# Patient Record
Sex: Female | Born: 1960 | Race: Black or African American | Hispanic: No | Marital: Single | Smoking: Current every day smoker
Health system: Southern US, Community
[De-identification: ages and names within clinical notes are randomized; demographics above are authoritative.]

## PROBLEM LIST (undated history)

## (undated) DIAGNOSIS — I619 Nontraumatic intracerebral hemorrhage, unspecified: Secondary | ICD-10-CM

## (undated) DIAGNOSIS — E785 Hyperlipidemia, unspecified: Secondary | ICD-10-CM

## (undated) DIAGNOSIS — F329 Major depressive disorder, single episode, unspecified: Secondary | ICD-10-CM

## (undated) DIAGNOSIS — F32A Depression, unspecified: Secondary | ICD-10-CM

## (undated) DIAGNOSIS — Q282 Arteriovenous malformation of cerebral vessels: Secondary | ICD-10-CM

## (undated) DIAGNOSIS — I10 Essential (primary) hypertension: Secondary | ICD-10-CM

## (undated) DIAGNOSIS — I639 Cerebral infarction, unspecified: Secondary | ICD-10-CM

## (undated) HISTORY — PX: TUBAL LIGATION: SHX77

---

## 2013-09-17 ENCOUNTER — Emergency Department: Payer: Self-pay | Admitting: Emergency Medicine

## 2013-09-17 LAB — BASIC METABOLIC PANEL
Anion Gap: 3 — ABNORMAL LOW (ref 7–16)
Chloride: 112 mmol/L — ABNORMAL HIGH (ref 98–107)
Creatinine: 0.86 mg/dL (ref 0.60–1.30)
EGFR (African American): >60
Osmolality: 274 (ref 275–301)
Potassium: 4.1 mmol/L (ref 3.5–5.1)

## 2013-09-17 LAB — CBC WITH DIFFERENTIAL/PLATELET
Basophil #: 0.1 10*3/uL (ref 0.0–0.1)
Eosinophil %: 1.5 %
HCT: 40.3 % (ref 35.0–47.0)
HGB: 12.9 g/dL (ref 12.0–16.0)
Lymphocyte #: 1.4 10*3/uL (ref 1.0–3.6)
MCH: 21.6 pg — ABNORMAL LOW (ref 26.0–34.0)
MCHC: 32 g/dL (ref 32.0–36.0)
MCV: 67 fL — ABNORMAL LOW (ref 80–100)
Monocyte %: 8 %
RDW: 15.7 % — ABNORMAL HIGH (ref 11.5–14.5)
WBC: 6.1 10*3/uL (ref 3.6–11.0)

## 2013-09-17 LAB — URINALYSIS, COMPLETE
Bacteria: NONE SEEN
Glucose,UR: NEGATIVE mg/dL (ref 0–75)
Ph: 5 (ref 4.5–8.0)
Protein: NEGATIVE
RBC,UR: 4 /HPF (ref 0–5)
Specific Gravity: 1.018 (ref 1.003–1.030)
Squamous Epithelial: 1
WBC UR: 29 /HPF (ref 0–5)

## 2013-09-17 LAB — DRUG SCREEN, URINE
Amphetamines, Ur Screen: NEGATIVE (ref ?–1000)
Barbiturates, Ur Screen: NEGATIVE (ref ?–200)
Cannabinoid 50 Ng, Ur ~~LOC~~: NEGATIVE (ref ?–50)
Cocaine Metabolite,Ur ~~LOC~~: NEGATIVE (ref ?–300)
MDMA (Ecstasy)Ur Screen: NEGATIVE (ref ?–500)
Methadone, Ur Screen: NEGATIVE (ref ?–300)
Opiate, Ur Screen: NEGATIVE (ref ?–300)

## 2013-09-17 LAB — ETHANOL
Ethanol %: <0.003 % (ref 0.000–0.080)
Ethanol: <3 mg/dL

## 2013-09-17 LAB — PROTIME-INR
INR: 1
Prothrombin Time: 13.7 secs (ref 11.5–14.7)

## 2013-09-17 LAB — TSH: Thyroid Stimulating Horm: 0.75 u[IU]/mL

## 2013-09-17 LAB — SALICYLATE LEVEL: Salicylates, Serum: 3.1 mg/dL — ABNORMAL HIGH

## 2013-09-17 LAB — ACETAMINOPHEN LEVEL: Acetaminophen: <2 ug/mL

## 2013-09-18 LAB — URINE CULTURE

## 2013-11-09 DIAGNOSIS — Z8774 Personal history of (corrected) congenital malformations of heart and circulatory system: Secondary | ICD-10-CM

## 2013-12-02 ENCOUNTER — Emergency Department: Payer: Self-pay | Admitting: Internal Medicine

## 2013-12-02 LAB — URINALYSIS, COMPLETE
Glucose,UR: NEGATIVE mg/dL (ref 0–75)
Hyaline Cast: 8
Specific Gravity: 1.033 (ref 1.003–1.030)
Squamous Epithelial: 2

## 2013-12-02 LAB — GC/CHLAMYDIA PROBE AMP

## 2013-12-03 LAB — WET PREP, GENITAL

## 2013-12-21 HISTORY — PX: BRAIN SURGERY: SHX531

## 2015-05-16 DIAGNOSIS — Z720 Tobacco use: Secondary | ICD-10-CM | POA: Diagnosis present

## 2015-05-16 DIAGNOSIS — I10 Essential (primary) hypertension: Secondary | ICD-10-CM | POA: Diagnosis present

## 2015-10-25 DIAGNOSIS — F482 Pseudobulbar affect: Secondary | ICD-10-CM | POA: Insufficient documentation

## 2015-10-25 DIAGNOSIS — G2581 Restless legs syndrome: Secondary | ICD-10-CM | POA: Insufficient documentation

## 2016-01-14 DIAGNOSIS — G4733 Obstructive sleep apnea (adult) (pediatric): Secondary | ICD-10-CM | POA: Insufficient documentation

## 2016-05-24 ENCOUNTER — Emergency Department
Admission: EM | Admit: 2016-05-24 | Discharge: 2016-05-24 | Disposition: A | Discharge status: Home / Self Care | Payer: Medicare Other | Attending: Emergency Medicine | Admitting: Emergency Medicine

## 2016-05-24 ENCOUNTER — Emergency Department: Payer: Medicare Other

## 2016-05-24 DIAGNOSIS — Y999 Unspecified external cause status: Secondary | ICD-10-CM | POA: Insufficient documentation

## 2016-05-24 DIAGNOSIS — S9032XA Contusion of left foot, initial encounter: Secondary | ICD-10-CM | POA: Diagnosis not present

## 2016-05-24 DIAGNOSIS — Y939 Activity, unspecified: Secondary | ICD-10-CM | POA: Insufficient documentation

## 2016-05-24 DIAGNOSIS — S40022A Contusion of left upper arm, initial encounter: Secondary | ICD-10-CM | POA: Insufficient documentation

## 2016-05-24 DIAGNOSIS — S99922A Unspecified injury of left foot, initial encounter: Secondary | ICD-10-CM | POA: Diagnosis present

## 2016-05-24 DIAGNOSIS — IMO0002 Reserved for concepts with insufficient information to code with codable children: Secondary | ICD-10-CM

## 2016-05-24 DIAGNOSIS — Y929 Unspecified place or not applicable: Secondary | ICD-10-CM | POA: Diagnosis not present

## 2016-05-24 MED ORDER — NAPROXEN 500 MG PO TABS: 500.0000 mg | ORAL_TABLET | Freq: Two times a day (BID) | ORAL | Status: DC
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
## 2016-05-24 NOTE — Discharge Instructions (Signed)
Foot Contusion A foot contusion is a deep bruise to the foot. Contusions are the result of an injury that caused bleeding under the skin. The contusion may turn blue, purple, or yellow. Minor injuries will give you a painless contusion, but more severe contusions may stay painful and swollen for a few weeks. CAUSES  A foot contusion comes from a direct blow to that area, such as a heavy object falling on the foot. SYMPTOMS   Swelling of the foot.  Discoloration of the foot.  Tenderness or soreness of the foot. DIAGNOSIS  You will have a physical exam and will be asked about your history. You may need an X-ray of your foot to look for a broken bone (fracture).  TREATMENT  An elastic wrap may be recommended to support your foot. Resting, elevating, and applying cold compresses to your foot are often the best treatments for a foot contusion. Over-the-counter medicines may also be recommended for pain control. HOME CARE INSTRUCTIONS   Put ice on the injured area.  Put ice in a plastic bag.  Place a towel between your skin and the bag.  Leave the ice on for 15-20 minutes, 03-04 times a day.  Only take over-the-counter or prescription medicines for pain, discomfort, or fever as directed by your caregiver.  If told, use an elastic wrap as directed. This can help reduce swelling. You may remove the wrap for sleeping, showering, and bathing. If your toes become numb, cold, or blue, take the wrap off and reapply it more loosely.  Elevate your foot with pillows to reduce swelling.  Try to avoid standing or walking while the foot is painful. Do not resume use until instructed by your caregiver. Then, begin use gradually. If pain develops, decrease use. Gradually increase activities that do not cause discomfort until you have normal use of your foot.  See your caregiver as directed. It is very important to keep all follow-up appointments in order to avoid any lasting problems with your foot,  including long-term (chronic) pain. SEEK IMMEDIATE MEDICAL CARE IF:   You have increased redness, swelling, or pain in your foot.  Your swelling or pain is not relieved with medicines.  You have loss of feeling in your foot or are unable to move your toes.  Your foot turns cold or blue.  You have pain when you move your toes.  Your foot becomes warm to the touch.  Your contusion does not improve in 2 days. MAKE SURE YOU:   Understand these instructions.  Will watch your condition.  Will get help right away if you are not doing well or get worse.   This information is not intended to replace advice given to you by your health care provider. Make sure you discuss any questions you have with your health care provider.   Document Released: 09/28/2006 Document Revised: 06/07/2012 Document Reviewed: 08/13/2015 Elsevier Interactive Patient Education 2016 [employer]  Cryotherapy Cryotherapy is when you put ice on your injury. Ice helps lessen pain and puffiness (swelling) after an injury. Ice works the best when you start using it in the first 24 to 48 hours after an injury. HOME CARE  Put a dry or damp towel between the ice pack and your skin.  You may press gently on the ice pack.  Leave the ice on for no more than 10 to 20 minutes at a time.  Check your skin after 5 minutes to make sure your skin is okay.  Rest at least  20 minutes between ice pack uses. °· Stop using ice when your skin loses feeling (numbness). °· Do not use ice on someone who cannot tell you when it hurts. This includes small children and people with memory problems (dementia). °GET HELP RIGHT AWAY IF: °· You have white spots on your skin. °· Your skin turns blue or pale. °· Your skin feels waxy or hard. °· Your puffiness gets worse. °MAKE SURE YOU:  °· Understand these instructions. °· Will watch your condition. °· Will get help right away if you are not doing well or get worse. °  °This information is not  intended to replace advice given to you by your health care provider. Make sure you discuss any questions you have with your health care provider. °  °Document Released: 05/25/2008 Document Revised: 02/29/2012 Document Reviewed: 07/30/2011 °Elsevier Interactive Patient Education ©2016 Elsevier Inc. ° °

## 2017-02-09 ENCOUNTER — Emergency Department: Payer: Medicare Other

## 2017-02-09 ENCOUNTER — Emergency Department
Admission: EM | Admit: 2017-02-09 | Discharge: 2017-02-09 | Disposition: A | Discharge status: Home / Self Care | Payer: Medicare Other | Attending: Emergency Medicine | Admitting: Emergency Medicine

## 2017-02-09 DIAGNOSIS — M5412 Radiculopathy, cervical region: Secondary | ICD-10-CM | POA: Diagnosis not present

## 2017-02-09 DIAGNOSIS — Z79899 Other long term (current) drug therapy: Secondary | ICD-10-CM | POA: Diagnosis not present

## 2017-02-09 DIAGNOSIS — Z5181 Encounter for therapeutic drug level monitoring: Secondary | ICD-10-CM | POA: Insufficient documentation

## 2017-02-09 DIAGNOSIS — I10 Essential (primary) hypertension: Secondary | ICD-10-CM | POA: Diagnosis not present

## 2017-02-09 DIAGNOSIS — R519 Headache, unspecified: Secondary | ICD-10-CM

## 2017-02-09 DIAGNOSIS — R51 Headache: Secondary | ICD-10-CM

## 2017-02-09 HISTORY — DX: Depression, unspecified: F32.A

## 2017-02-09 HISTORY — DX: Arteriovenous malformation of cerebral vessels: Q28.2

## 2017-02-09 HISTORY — DX: Major depressive disorder, single episode, unspecified: F32.9

## 2017-02-09 HISTORY — DX: Nontraumatic intracerebral hemorrhage, unspecified: I61.9

## 2017-02-09 HISTORY — DX: Essential (primary) hypertension: I10

## 2017-02-09 LAB — COMPREHENSIVE METABOLIC PANEL
ALBUMIN: 3.9 g/dL (ref 3.5–5.0)
ALT: 17 U/L (ref 14–54)
AST: 18 U/L (ref 15–41)
Alkaline Phosphatase: 76 U/L (ref 38–126)
Anion gap: 6 (ref 5–15)
BILIRUBIN TOTAL: 0.3 mg/dL (ref 0.3–1.2)
BUN: 12 mg/dL (ref 6–20)
CO2: 24 mmol/L (ref 22–32)
Calcium: 8.9 mg/dL (ref 8.9–10.3)
Chloride: 109 mmol/L (ref 101–111)
Creatinine, Ser: 0.66 mg/dL (ref 0.44–1.00)
GFR calc Af Amer: >60 mL/min (ref >60)
GFR calc non Af Amer: >60 mL/min (ref >60)
GLUCOSE: 90 mg/dL (ref 65–99)
POTASSIUM: 3.8 mmol/L (ref 3.5–5.1)
Sodium: 139 mmol/L (ref 135–145)
TOTAL PROTEIN: 7.4 g/dL (ref 6.5–8.1)

## 2017-02-09 LAB — CBC WITH DIFFERENTIAL/PLATELET
BASOS ABS: 0 10*3/uL (ref 0–0.1)
Basophils Relative: 1 %
EOS PCT: 3 %
Eosinophils Absolute: 0.1 10*3/uL (ref 0–0.7)
HCT: 37.9 % (ref 35.0–47.0)
Hemoglobin: 12.4 g/dL (ref 12.0–16.0)
LYMPHS ABS: 1.7 10*3/uL (ref 1.0–3.6)
LYMPHS PCT: 31 %
MCH: 21.5 pg — AB (ref 26.0–34.0)
MCHC: 32.8 g/dL (ref 32.0–36.0)
MCV: 65.5 fL — AB (ref 80.0–100.0)
MONO ABS: 0.4 10*3/uL (ref 0.2–0.9)
MONOS PCT: 7 %
Neutro Abs: 3.1 10*3/uL (ref 1.4–6.5)
Neutrophils Relative %: 58 %
PLATELETS: 257 10*3/uL (ref 150–440)
RBC: 5.78 MIL/uL — ABNORMAL HIGH (ref 3.80–5.20)
RDW: 16.1 % — AB (ref 11.5–14.5)
WBC: 5.3 10*3/uL (ref 3.6–11.0)

## 2017-02-09 LAB — PROTIME-INR
INR: 0.9
Prothrombin Time: 12.1 seconds (ref 11.4–15.2)

## 2017-02-09 MED ORDER — BUTALBITAL-APAP-CAFFEINE 50-325-40 MG PO TABS: 1.0000 | ORAL_TABLET | Freq: Four times a day (QID) | ORAL | 0 refills | Status: AC | PRN

## 2017-02-09 MED ORDER — PROCHLORPERAZINE EDISYLATE 5 MG/ML IJ SOLN
10.0000 mg | Freq: Once | INTRAMUSCULAR | Status: AC
  Administered 2017-02-09: 10 mg via INTRAVENOUS
  Filled 2017-02-09: qty 2

## 2017-02-09 MED ORDER — MORPHINE SULFATE (PF) 4 MG/ML IV SOLN
4.0000 mg | Freq: Once | INTRAVENOUS | Status: AC
  Administered 2017-02-09: 4 mg via INTRAVENOUS
  Filled 2017-02-09: qty 1

## 2017-02-09 MED ORDER — IOPAMIDOL (ISOVUE-370) INJECTION 76%
75.0000 mL | Freq: Once | INTRAVENOUS | Status: AC | PRN
  Administered 2017-02-09: 75 mL via INTRAVENOUS

## 2017-02-09 NOTE — Discharge Instructions (Signed)
Please return to the emergency department for any new or worsening symptoms such as worsening headache, numbness, weakness, or for any other concerns. Follow up with your primary care physician as scheduled. The pain in her neck and shoulder may be related to a pinched nerve in her neck so you might need an MRI of her neck in the future.  Ct Angio Head W Or Wo Contrast  Result Date: 02/09/2017 CLINICAL DATA:  Sudden onset right-sided headache. Neck pain and right arm weakness. History of craniotomy for AVM. History of cerebral hemorrhage EXAM: CT ANGIOGRAPHY HEAD AND NECK TECHNIQUE: Multidetector CT imaging of the head and neck was performed using the standard protocol during bolus administration of intravenous contrast. Multiplanar CT image reconstructions and MIPs were obtained to evaluate the vascular anatomy. Carotid stenosis measurements (when applicable) are obtained utilizing NASCET criteria, using the distal internal carotid diameter as the denominator. CONTRAST:  75 mL Isovue 370 IV COMPARISON:  CT head 09/17/2013.  CT head 02/09/2017 FINDINGS: CTA NECK FINDINGS Aortic arch: Bovine branching of the aortic arch. Proximal great vessels widely patent. Aortic arch intact. Right carotid system: Right carotid artery widely patent without significant stenosis. Early atherosclerotic calcification of the bifurcation. No dissection Left carotid system: Left carotid artery widely patent. Mild atherosclerotic calcification at the bifurcation without significant stenosis. Negative for dissection. Vertebral arteries: Both vertebral arteries are patent to the basilar with minimal atherosclerotic calcification distally. No significant stenosis or dissection Skeleton: Disc degeneration and spurring at C5-6 and C6-7. No acute skeletal abnormality. Other neck: Negative for mass or adenopathy. Upper chest: Negative Review of the MIP images confirms the above findings CTA HEAD FINDINGS Anterior circulation: Early  atherosclerotic calcification in the cavernous carotid bilaterally without stenosis or aneurysm. Single anterior cerebral artery compatible with azygos anterior cerebral artery variant. Both middle cerebral arteries are patent without stenosis. Negative for vascular malformation or aneurysm. Posterior circulation: Both vertebral arteries patent to the basilar. Mild atherosclerotic calcification distal vertebral artery bilaterally without stenosis. Basilar widely patent. Posterior cerebral arteries patent bilaterally. Negative for vascular malformation. No early draining vein. Venous sinuses: Superior sagittal sinus patent. Straight sinus patent. Transverse and sigmoid sinus patent bilaterally. 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 Delayed phase: Normal enhancement on delayed imaging. Prior right parietal craniotomy with encephalomalacia in the right parietal lobe, by history related to AVM treatment. 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 No dissection. Negative for intracranial stenosis or large vessel occlusion. Negative for residual vascular malformation. 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 Electronically Signed   By: Marlan Palau M.D.   On: 02/09/2017 19:19   Ct Head Wo Contrast  Result Date: 02/09/2017 CLINICAL DATA:  Headache with right arm tingling. EXAM: CT HEAD WITHOUT CONTRAST TECHNIQUE: Contiguous axial images were obtained from the base of the skull through the vertex without intravenous contrast. COMPARISON:  09/17/2013 FINDINGS: Brain: Encephalomalacia right parietal occipital region likely postsurgical given the overlying craniotomy defect. No evidence for acute hemorrhage, hydrocephalus, mass  lesion, or abnormal extra-axial fluid collection. Vascular: Atherosclerotic calcification is visualized in the carotid arteries. No dense MCA sign. Major dural sinuses are unremarkable. Skull: Right parieto-occipital craniotomy defect. Sinuses/Orbits: The visualized paranasal sinuses and mastoid air cells are clear. Visualized portions of the globes and intraorbital fat are unremarkable. Other: None.  IMPRESSION: 1. 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 By report, this is AVM related. 2. No acute intracranial abnormality. Electronically Signed   By: Kennith CenterEric  Mansell M.D.   On: 02/09/2017 16:40   Ct Angio Neck W And/or Wo Contrast  Result Date: 02/09/2017 CLINICAL DATA:  Sudden onset right-sided headache. Neck pain and right arm weakness. History of craniotomy for AVM. History of cerebral hemorrhage EXAM: CT ANGIOGRAPHY HEAD AND NECK TECHNIQUE: Multidetector CT imaging of the head and neck was performed using the standard protocol during bolus administration of intravenous contrast. Multiplanar CT image reconstructions and MIPs were obtained to evaluate the vascular anatomy. Carotid stenosis measurements (when applicable) are obtained utilizing NASCET criteria, using the distal internal carotid diameter as the denominator. CONTRAST:  75 mL Isovue 370 IV COMPARISON:  CT head 09/17/2013.  CT head 02/09/2017 FINDINGS: CTA NECK FINDINGS Aortic arch: Bovine branching of the aortic arch. Proximal great vessels widely patent. Aortic arch intact. Right carotid system: Right carotid artery widely patent without significant stenosis. Early atherosclerotic calcification of the bifurcation. No dissection Left carotid system: Left carotid artery widely patent. Mild atherosclerotic calcification at the bifurcation without significant stenosis. Negative for dissection. Vertebral arteries: Both vertebral arteries are patent to the basilar with minimal atherosclerotic calcification  distally. No significant stenosis or dissection Skeleton: Disc degeneration and spurring at C5-6 and C6-7. No acute skeletal abnormality. Other neck: Negative for mass or adenopathy. Upper chest: Negative Review of the MIP images confirms the above findings CTA HEAD FINDINGS Anterior circulation: Early atherosclerotic calcification in the cavernous carotid bilaterally without stenosis or aneurysm. Single anterior cerebral artery compatible with azygos anterior cerebral artery variant. Both middle cerebral arteries are patent without stenosis. Negative for vascular malformation or aneurysm. Posterior circulation: Both vertebral arteries patent to the basilar. Mild atherosclerotic calcification distal vertebral artery bilaterally without stenosis. Basilar widely patent. Posterior cerebral arteries patent bilaterally. Negative for vascular malformation. No early draining vein. Venous sinuses: Superior sagittal sinus patent. Straight sinus patent. Transverse and sigmoid sinus patent bilaterally. 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 Delayed phase: Normal enhancement on delayed imaging. Prior right parietal craniotomy with encephalomalacia in the right parietal lobe, by history related to AVM treatment. 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 No dissection. Negative for intracranial stenosis or large vessel occlusion. Negative for residual vascular malformation. 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 Electronically Signed   By: Marlan Palauharles  Clark M.D.   On: 02/09/2017 19:19

## 2017-02-09 NOTE — ED Provider Notes (Signed)
[facility] Emergency Department Provider Note  ____________________________________________   First MD Initiated Contact with Patient 02/09/17 1734     (approximate)  I have reviewed the triage vital signs and the nursing notes.   HISTORY  Chief Complaint Headache and Tingling    HPI Kristin Burgess is a 56 y.o. female who comes to the emergency department with 3 days of sudden onset maximal onset right sided throbbing headache similar to previous headaches.In 2014 and she had a ruptured AVM repaired at [facility]. She has some residual weakness in her left upper extremity but for the past 2 weeks or so has noted aching discomfort in her right shoulder and right arm. She denies double vision or blurred vision. She has some photophobia. No neck stiffness. No chest pain or shortness of breath. No abdominal pain nausea or vomiting. No medications are hormone for her headache.   Past Medical History:  Diagnosis Date  . AVM (arteriovenous malformation) brain   . Brain bleed (HCC)   . Depression   . Hypertension     There are no active problems to display for this patient.   Past Surgical History:  Procedure Laterality Date  . BRAIN SURGERY      Prior to Admission medications   Medication Sig Start Date End Date Taking? Authorizing Provider  butalbital-acetaminophen-caffeine (FIORICET, ESGIC) [phone] MG tablet Take 1-2 tablets by mouth every 6 (six) hours as needed for headache. 02/09/17 02/09/18  Merrily Brittle, MD  naproxen (NAPROSYN) 500 MG tablet Take 1 tablet (500 mg total) by mouth 2 (two) times daily with a meal. 05/24/16   Jami L Hagler, PA-C    Allergies Patient has no known allergies.  No family history on file.  Social History Social History  Substance Use Topics  . Smoking status: Never Smoker  . Smokeless tobacco: Never Used  . Alcohol use No    Review of Systems Constitutional: No fever/chills Eyes: No visual changes. ENT:  No sore throat. Cardiovascular: Denies chest pain. Respiratory: Denies shortness of breath. Gastrointestinal: No abdominal pain.  No nausea, no vomiting.  No diarrhea.  No constipation. Genitourinary: Negative for dysuria. Musculoskeletal: Negative for back pain. Skin: Negative for rash. Neurological: Positive for headache  10-point ROS otherwise negative.  ____________________________________________   PHYSICAL EXAM:  VITAL SIGNS: ED Triage Vitals [02/09/17 1607]  Enc Vitals Group     BP (!) 177/98     Pulse Rate 89     Resp 18     Temp 98.1 F (36.7 C)     Temp Source Oral     SpO2 97 %     Weight 250 lb (113.4 kg)     Height 5\' 5"  (1.651 m)     Head Circumference      Peak Flow      Pain Score 8     Pain Loc      Pain Edu?      Excl. in GC?     Constitutional: Alert and oriented 4 pleasant and cooperative speaks in full clear sentences no diaphoresis Eyes: Conjunctivae are normal. PERRL. EOMI. pupils are midrange and reactive bilaterally equal brisk bleeding on funduscopy Head: Atraumatic. Nose: No congestion/rhinnorhea. Mouth/Throat: Mucous membranes are moist.  Oropharynx non-erythematous. Neck: No stridor.   Cardiovascular: Normal rate, regular rhythm. Grossly normal heart sounds.  Good peripheral circulation. Respiratory: Normal respiratory effort.  No retractions. Lungs CTAB. Gastrointestinal: Soft and nontender. No distention. No abdominal bruits. No CVA tenderness. Musculoskeletal: No lower  extremity tenderness nor edema.  No joint effusions. Neurologic:  Normal speech and language. No pronator drift 5 out of 5 grips biceps triceps 5 out of 5 hip flexion and extension plantar flexion dorsiflexion sensation intact to light touch throughout 2+ DTRs no ankle clonus Skin:  Skin is warm, dry and intact. No rash noted. Psychiatric: Mood and affect are normal. Speech and behavior are normal.  ____________________________________________   LABS (all labs  ordered are listed, but only abnormal results are displayed)  Labs Reviewed  CBC WITH DIFFERENTIAL/PLATELET - Abnormal; Notable for the following:       Result Value   RBC 5.78 (*)    MCV 65.5 (*)    MCH 21.5 (*)    RDW 16.1 (*)    All other components within normal limits  COMPREHENSIVE METABOLIC PANEL  PROTIME-INR   ____________________________________________  EKG  ____________________________________________  RADIOLOGY  Head CT with no bleed and no aneurysm ____________________________________________   PROCEDURES  Procedure(s) performed: no  Procedures  Critical Care performed: no  ____________________________________________   INITIAL IMPRESSION / ASSESSMENT AND PLAN / ED COURSE  Pertinent labs & imaging results that were available during my care of the patient were reviewed by me and considered in my medical decision making (see chart for details).  Fortunately the patient is neurologically intact. Her headache was sudden onset and different from previous headaches in the setting of a previous known AV malformation which was concerning for possible subarachnoid hemorrhage. CT angiogram done which is negative for acute pathology and the patient's symptoms are improved. At this point she is comfortable being discharged home with follow-up with neurology and neurosurgery. Strict return to the emergency department precautions given. She is medically stable for outpatient management.      ____________________________________________   FINAL CLINICAL IMPRESSION(S) / ED DIAGNOSES  Final diagnoses:  Nonintractable headache, unspecified chronicity pattern, unspecified headache type  Cervical radiculopathy      NEW MEDICATIONS STARTED DURING THIS VISIT:  Discharge Medication List as of 02/09/2017  7:52 PM    START taking these medications   Details  butalbital-acetaminophen-caffeine (FIORICET, ESGIC) 50-325-40 MG tablet Take 1-2 tablets by mouth every 6  (six) hours as needed for headache., Starting Tue 02/09/2017, Until Wed 02/09/2018, Print         Note:  This document was prepared using Dragon voice recognition software and may include unintentional dictation errors.     Merrily BrittleNeil Setsuko Robins, MD 02/10/17 1537

## 2017-02-09 NOTE — ED Notes (Signed)
Patient transported to CT 

## 2017-02-09 NOTE — ED Triage Notes (Signed)
Pt c/o frontal HA with right arm tingling pain for the past 2 weeks. Pt states she has a hx of brain bleed 1-2 years ago and was concerned.

## 2017-02-09 NOTE — ED Notes (Signed)

## 2017-04-02 DIAGNOSIS — Z87898 Personal history of other specified conditions: Secondary | ICD-10-CM

## 2017-04-08 ENCOUNTER — Other Ambulatory Visit: Payer: Self-pay

## 2017-04-26 NOTE — Progress Notes (Deleted)
04/27/2017 12:18 PM   Kristin Burgess March 16, 1961 [id number]  Referring provider: Cyndie Mull, DO [address] [place], [place] [address]  No chief complaint on file.   HPI: Patient is a 56 -year-old [place] American female who is referred to [place] by, Dr. Ludwig Clarks, for urinary incontinence.  Patient states that she has had urinary incontinence for ***.  Patient has incontinence with ***.   She is experiencing *** incontinent episodes during the day. She is experiencing *** incontinent episodes during the night.  Her incontinence volume is ***.   She is wearing *** pads/depends daily.    She is having associated urinary frequency, urgency, dysuria, nocturia, intermittency, hesitancy, straining to urinate and weak urinary stream.   ***  She is not having associated urinary frequency, urgency, dysuria, nocturia, intermittency, hesitancy, straining to urinate and weak urinary stream.   ***  She does/does not have a history of urinary tract infections, STI's or injury to the bladder. ***  She denies/endorses dysuria, gross hematuria, suprapubic pain, back pain, abdominal pain or flank pain.***  She denies/endorses dysuria, gross hematuria, suprapubic pain, back pain, abdominal pain or flank pain.***  She has not had any recent fevers, chills, nausea or vomiting. ***  She has not had any recent fevers, chills, nausea or vomiting. ***  She does/does not have a history of nephrolithiasis, GU surgery or GU trauma. ***  She does/does not have a history of nephrolithiasis, GU surgery or GU trauma. ***  She is/is not sexually active.  She has/has not noted incontinence with sexual intercourse.  ***   She is post menopausal. ***  She admits to/denies constipation and/or diarrhea. ***  She is having/ not having pain with bladder filling.  ***  She has not had any recent imaging studies.    She is drinking *** of water daily.   She is drinking *** caffeinated beverages  daily.  She is drinking *** alcoholic beverages daily.    Her risk factors for incontinence are obesity, para T, vaginal delivery, a family history of incontinence, age, smoking, caffeine, diabetes, stroke, sleep apnea, depression, fecal incontinence, vaginal atrophy, oral estrogens, pelvic surgery, pelvic radiation and/or neurological disorders. ***  She is taking antihistamines, decongestants, benzo's, opioids, OAB medication, ACE inhibitors, alpha-agonists, alpha blockers, antiarrhythmics, diuretics, antidepressants, antipsychotics, skeletal muscle relaxants and/or oral estrogen.         PMH: Past Medical History:  Diagnosis Date  . AVM (arteriovenous malformation) brain   . Brain bleed (HCC)   . Depression   . Hypertension     Surgical History: Past Surgical History:  Procedure Laterality Date  . BRAIN SURGERY      Home Medications:  Allergies as of 04/27/2017   No Known Allergies     Medication List       Accurate as of 04/26/17 12:18 PM. Always use your most recent med list.          butalbital-acetaminophen-caffeine 50-325-40 MG tablet Commonly known as:  FIORICET, ESGIC Take 1-2 tablets by mouth every 6 (six) hours as needed for headache.   naproxen 500 MG tablet Commonly known as:  NAPROSYN Take 1 tablet (500 mg total) by mouth 2 (two) times daily with a meal.       Allergies: No Known Allergies  Family History: No family history on file.  Social History:  reports that she has never smoked. She has never used smokeless tobacco. She reports that she does not drink alcohol. Her drug history is  not on file.  ROS:                                        Physical Exam: There were no vitals taken for this visit.  Constitutional: Well nourished. Alert and oriented, No acute distress. HEENT: [place] AT, moist mucus membranes. Trachea midline, no masses. Cardiovascular: No clubbing, cyanosis, or edema. Respiratory: Normal respiratory  effort, no increased work of breathing. GI: Abdomen is soft, non tender, non distended, no abdominal masses. Liver and spleen not palpable.  No hernias appreciated.  Stool sample for occult testing is not indicated.   GU: No CVA tenderness.  No bladder fullness or masses.  Normal external genitalia, normal pubic hair distribution, no lesions.  Normal urethral meatus, no lesions, no prolapse, no discharge.   No urethral masses, tenderness and/or tenderness. No bladder fullness, tenderness or masses. Normal vagina mucosa, good estrogen effect, no discharge, no lesions, good pelvic support, no cystocele or rectocele noted.  No cervical motion tenderness.  Uterus is freely mobile and non-fixed.  No adnexal/parametria masses or tenderness noted.  Anus and perineum are without rashes or lesions.   *** Skin: No rashes, bruises or suspicious lesions. Lymph: No cervical or inguinal adenopathy. Neurologic: Grossly intact, no focal deficits, moving all 4 extremities. Psychiatric: Normal mood and affect.  Laboratory Data: Lab Results  Component Value Date   WBC 5.3 02/09/2017   HGB 12.4 02/09/2017   HCT 37.9 02/09/2017   MCV 65.5 (L) 02/09/2017   PLT 257 02/09/2017    Lab Results  Component Value Date   CREATININE 0.66 02/09/2017    Lab Results  Component Value Date   TSH 0.75 09/17/2013    Lab Results  Component Value Date   AST 18 02/09/2017   Lab Results  Component Value Date   ALT 17 02/09/2017     Urinalysis    Component Value Date/Time   COLORURINE Amber 12/02/2013 1650   APPEARANCEUR Hazy 12/02/2013 1650   LABSPEC 1.033 12/02/2013 1650   PHURINE 5.0 12/02/2013 1650   GLUCOSEU Negative 12/02/2013 1650   HGBUR Negative 12/02/2013 1650   BILIRUBINUR Negative 12/02/2013 1650   KETONESUR Trace 12/02/2013 1650   PROTEINUR 30 mg/dL 16/10/960412/13/2014 [id number]   NITRITE Negative 12/02/2013 1650   LEUKOCYTESUR Negative 12/02/2013 1650    Pertinent Imaging: ***  Assessment & Plan:   ***  Incontinence  - offered behavioral therapies  - bladder training  - bladder control strategies  - pelvic floor muscle training  - fluid management   - offered medical therapy with anticholinergic therapy or beta-3 adrenergic receptor agonist and the potential side effects of each therapy ***  - offered refer to gynecology for a pessary fitting ***  - offered an appointment with one of our surgeon for a possible pelvic sling procedure ***  - would like to try the beta-3 adrenergic receptor agonist (Myrbetriq).  Given Myrbetriq 25 mg samples, #28.  I have reviewed with the patient of the side effects of Myrbetriq, such as: elevation in BP, urinary retention and/or HA.  She will return in one month for PVR and symptom recheck.  ***  - would like to try anticholinergic therapy.  Given Vesicare 5mg /10mg , Toviaz 4mg /8mg  samples, # 28.   Advised of the side effects, such as: Dry eyes, dry mouth, constipation, mental confusion and/or urinary retention. ***  - RTC in 3 weeks for PVR  and symptom recheck ***     No Follow-up on file.  These notes generated with voice recognition software. I apologize for typographical errors.  Michiel Cowboy, PA-C  [facility] [address] [place], [place] [address] [phone]

## 2017-04-27 ENCOUNTER — Ambulatory Visit: Payer: Medicare Other | Admitting: Urology

## 2017-05-24 DIAGNOSIS — R32 Unspecified urinary incontinence: Secondary | ICD-10-CM | POA: Insufficient documentation

## 2017-05-31 ENCOUNTER — Ambulatory Visit (INDEPENDENT_AMBULATORY_CARE_PROVIDER_SITE_OTHER): Payer: Medicare Other | Admitting: Urology

## 2017-05-31 ENCOUNTER — Encounter: Payer: Self-pay | Admitting: Urology

## 2017-05-31 VITALS — BP 146/83 | HR 84 | Ht 65.0 in | Wt 264.2 lb

## 2017-05-31 DIAGNOSIS — R32 Unspecified urinary incontinence: Secondary | ICD-10-CM

## 2017-05-31 DIAGNOSIS — N3946 Mixed incontinence: Secondary | ICD-10-CM | POA: Diagnosis not present

## 2017-05-31 LAB — URINALYSIS, COMPLETE
BILIRUBIN UA: NEGATIVE
Glucose, UA: NEGATIVE
KETONES UA: NEGATIVE
NITRITE UA: NEGATIVE
PH UA: 5 (ref 5.0–7.5)
Protein, UA: NEGATIVE
Specific Gravity, UA: >1.03 — ABNORMAL HIGH (ref 1.005–1.030)
UUROB: 0.2 mg/dL (ref 0.2–1.0)

## 2017-05-31 LAB — MICROSCOPIC EXAMINATION

## 2017-05-31 NOTE — Progress Notes (Signed)
05/31/2017 2:59 PM   Kristin HashimotoAlisha Burgess 07/20/1961 [id number]  Referring provider: Cyndie MullSantayana, Kristin Patricia, DO [address] [address] [place], [place] [id number]  Chief Complaint  Patient presents with  . New Patient (Initial Visit)    Urinary Incontinence  referred by Kristin SpanishGloria Santiana MD     HPI: I was consult to assess the patient's worsening incontinence over many months. She leaks with coughing sneezing bending and lifting. She has urgency incontinence. I think both are significant though this was less clear. She can leak without awareness and she has mild bedwetting. She wears 5 pads per day moderately wet  She voids every 1 hour. She cannot hold urination for 2 hours. She gets up twice at night.  Her flow is sometimes poor and sometimes a good  She is a borderline diabetic. She has no other neurologic issues. She has not had a hysterectomy. She has not had previous GU surgery or kidney stones and she does not get bladder infections. The presentation has not been previously treated  Modifying factors: There are no other modifying factors  Associated signs and symptoms: There are no other associated signs and symptoms Aggravating and relieving factors: There are no other aggravating or relieving factors Severity: Moderate Duration: Persistent     PMH: Past Medical History:  Diagnosis Date  . AVM (arteriovenous malformation) brain   . Brain bleed (HCC)   . Depression   . Hypertension     Surgical History: Past Surgical History:  Procedure Laterality Date  . BRAIN SURGERY  2015  . TUBAL LIGATION      Home Medications:  Allergies as of 05/31/2017      Reactions   Latex Itching      Medication List       Accurate as of 05/31/17  2:59 PM. Always use your most recent med list.          amLODipine 5 MG tablet Commonly known as:  NORVASC Take by mouth.   butalbital-acetaminophen-caffeine 50-325-40 MG tablet Commonly known as:  FIORICET, ESGIC Take 1-2 tablets by mouth  every 6 (six) hours as needed for headache.   citalopram 10 MG tablet Commonly known as:  CELEXA Take by mouth.   naproxen 500 MG tablet Commonly known as:  NAPROSYN Take 1 tablet (500 mg total) by mouth 2 (two) times daily with a meal.   Vitamin D (Ergocalciferol) 50000 units Caps capsule Commonly known as:  DRISDOL Take by mouth.       Allergies:  Allergies  Allergen Reactions  . Latex Itching    Family History: Family History  Problem Relation Age of Onset  . Lung cancer Maternal Grandmother   . Colon cancer Maternal Grandfather   . Kidney cancer Neg Hx   . Bladder Cancer Neg Hx     Social History:  reports that she has been smoking.  She has never used smokeless tobacco. She reports that she does not drink alcohol or use drugs.  ROS: UROLOGY Frequent Urination?: Yes Hard to postpone urination?: No Burning/pain with urination?: No Get up at night to urinate?: Yes Leakage of urine?: Yes Urine stream starts and stops?: Yes Trouble starting stream?: No Do you have to strain to urinate?: No Blood in urine?: Yes Urinary tract infection?: No Sexually transmitted disease?: No Injury to kidneys or bladder?: No Painful intercourse?: Yes Weak stream?: No Currently pregnant?: No Vaginal bleeding?: No Last menstrual period?: n  Gastrointestinal Nausea?: No Vomiting?: No Indigestion/heartburn?: No Diarrhea?: No Constipation?: No  Constitutional  Fever: No Night sweats?: Yes Weight loss?: No Fatigue?: No  Skin Skin rash/lesions?: No Itching?: No  Eyes Blurred vision?: No Double vision?: No  Ears/Nose/Throat Sore throat?: No Sinus problems?: No  Hematologic/Lymphatic Swollen glands?: No Easy bruising?: No  Cardiovascular Leg swelling?: No Chest pain?: No  Respiratory Cough?: Yes Shortness of breath?: Yes  Endocrine Excessive thirst?: No  Musculoskeletal Back pain?: No Joint pain?: No  Neurological Headaches?: No Dizziness?:  No  Psychologic Depression?: No Anxiety?: No  Physical Exam: BP (!) 146/83   Pulse 84   Ht 5\' 5"  (1.651 m)   Wt 264 lb 3.2 oz (119.8 kg)   BMI 43.97 kg/m   Constitutional:  Alert and oriented, No acute distress. HEENT: [place] AT, moist mucus membranes.  Trachea midline, no masses. Cardiovascular: No clubbing, cyanosis, or edema. Respiratory: Normal respiratory effort, no increased work of breathing. GI: Abdomen is soft, nontender, nondistended, no abdominal masses GU: Mild to moderate grade 2 hypermobility of the bladder neck and negative cough test. No prolapse Skin: No rashes, bruises or suspicious lesions. Lymph: No cervical or inguinal adenopathy. Neurologic: Grossly intact, no focal deficits, moving all 4 extremities. Psychiatric: Normal mood and affect.  Laboratory Data: Lab Results  Component Value Date   WBC 5.3 02/09/2017   HGB 12.4 02/09/2017   HCT 37.9 02/09/2017   MCV 65.5 (L) 02/09/2017   PLT 257 02/09/2017    Lab Results  Component Value Date   CREATININE 0.66 02/09/2017    No results found for: PSA  No results found for: TESTOSTERONE  No results found for: HGBA1C  Urinalysis    Component Value Date/Time   COLORURINE Amber 12/02/2013 1650   APPEARANCEUR Hazy 12/02/2013 1650   LABSPEC 1.033 12/02/2013 1650   PHURINE 5.0 12/02/2013 1650   GLUCOSEU Negative 12/02/2013 1650   HGBUR Negative 12/02/2013 1650   BILIRUBINUR Negative 12/02/2013 1650   KETONESUR Trace 12/02/2013 1650   PROTEINUR 30 mg/dL 91/47/8295 6213   NITRITE Negative 12/02/2013 1650   LEUKOCYTESUR Negative 12/02/2013 1650    Pertinent Imaging: none  Assessment & Plan:  The patient has mixed incontinence, leakage not associated with awareness, mild bedwetting, moderate frequency and mild nocturia. The role of urodynamics was discussed. Based upon her clinical presentation and pelvic examination the overactive bladder may be the dominant problem.  1. Urinary incontinence,  unspecified type 2. Mixed incontinence 3. Urinary incontinence - Urinalysis, Complete   No Follow-up on file.  Martina Sinner, MD  [facility] [address], [address] [place], [place] [address] [phone]

## 2017-06-02 LAB — CULTURE, URINE COMPREHENSIVE

## 2017-07-07 ENCOUNTER — Ambulatory Visit: Payer: Medicare Other

## 2017-07-12 ENCOUNTER — Ambulatory Visit (INDEPENDENT_AMBULATORY_CARE_PROVIDER_SITE_OTHER): Payer: Medicare Other | Admitting: Urology

## 2017-07-12 ENCOUNTER — Encounter: Payer: Self-pay | Admitting: Urology

## 2017-07-12 VITALS — BP 148/85 | Ht 65.0 in | Wt 267.3 lb

## 2017-07-12 DIAGNOSIS — N3946 Mixed incontinence: Secondary | ICD-10-CM | POA: Diagnosis not present

## 2017-07-12 MED ORDER — SOLIFENACIN SUCCINATE 5 MG PO TABS: 5.0000 mg | ORAL_TABLET | Freq: Every day | ORAL | 11 refills | Status: DC

## 2017-07-12 NOTE — Progress Notes (Signed)
07/12/2017 10:57 AM   Kristin Burgess [birth year] [id number]  Referring provider: Cyndie Mull, DO [address] [place], [place] [address]  Chief Complaint  Patient presents with  . Follow-up    HPI: I was consult to assess the patient's worsening incontinence over many months. She leaks with coughing sneezing bending and lifting. She has urgency incontinence. I think both are significant though this was less clear. She can leak without awareness and she has mild bedwetting. She wears 5 pads per day moderately wet  She voids every 1 hour. She cannot hold urination for 2 hours. She gets up twice at night.  Her flow is sometimes poor and sometimes a good  Mild to moderate grade 2 hypermobility of the bladder neck and negative cough test. No prolapse  The patient has mixed incontinence, leakage not associated with awareness, mild bedwetting, moderate frequency and mild nocturia. The role of urodynamics was discussed. Based upon her clinical presentation and pelvic examination the overactive bladder may be the dominant problem.  Today Frequency and incontinence are stable. Clinically not infected. They urodynamics test isn't 2 days  There might have been a mix up in scheduling    PMH: Past Medical History:  Diagnosis Date  . AVM (arteriovenous malformation) brain   . Brain bleed (HCC)   . Depression   . Hypertension     Surgical History: Past Surgical History:  Procedure Laterality Date  . BRAIN SURGERY  2015  . TUBAL LIGATION      Home Medications:  Allergies as of 07/12/2017      Reactions   Latex Itching      Medication List       Accurate as of 07/12/17 10:57 AM. Always use your most recent med list.          amLODipine 5 MG tablet Commonly known as:  NORVASC Take by mouth.   butalbital-acetaminophen-caffeine 50-325-40 MG tablet Commonly known as:  FIORICET, ESGIC Take 1-2 tablets by mouth every 6 (six) hours as needed for headache.     citalopram 10 MG tablet Commonly known as:  CELEXA Take by mouth.   naproxen 500 MG tablet Commonly known as:  NAPROSYN Take 1 tablet (500 mg total) by mouth 2 (two) times daily with a meal.   solifenacin 5 MG tablet Commonly known as:  VESICARE Take 1 tablet (5 mg total) by mouth daily.       Allergies:  Allergies  Allergen Reactions  . Latex Itching    Family History: Family History  Problem Relation Age of Onset  . Lung cancer Maternal Grandmother   . Colon cancer Maternal Grandfather   . Kidney cancer Neg Hx   . Bladder Cancer Neg Hx     Social History:  reports that she has been smoking.  She has never used smokeless tobacco. She reports that she does not drink alcohol or use drugs.  ROS: UROLOGY Frequent Urination?: No Hard to postpone urination?: No Burning/pain with urination?: No Get up at night to urinate?: No Leakage of urine?: Yes Urine stream starts and stops?: No Trouble starting stream?: No Do you have to strain to urinate?: No Blood in urine?: No Urinary tract infection?: No Sexually transmitted disease?: No Injury to kidneys or bladder?: No Painful intercourse?: No Weak stream?: No Currently pregnant?: No Vaginal bleeding?: No Last menstrual period?: n  Gastrointestinal Nausea?: Yes Vomiting?: No Indigestion/heartburn?: No Diarrhea?: No Constipation?: No  Constitutional Fever: No Night sweats?: No Weight loss?: No Fatigue?: No  Skin Skin rash/lesions?: No Itching?: No  Eyes Blurred vision?: No Double vision?: No  Ears/Nose/Throat Sore throat?: No Sinus problems?: No  Hematologic/Lymphatic Swollen glands?: No Easy bruising?: No  Cardiovascular Leg swelling?: Yes Chest pain?: No  Respiratory Cough?: Yes Shortness of breath?: Yes  Endocrine Excessive thirst?: No  Musculoskeletal Back pain?: No Joint pain?: No  Neurological Headaches?: No Dizziness?: No  Psychologic Depression?: No Anxiety?:  No  Physical Exam: BP (!) 148/85 (BP Location: Left Arm, Patient Position: Sitting, Cuff Size: Large)   Ht 5\' 5"  (1.651 m)   Wt 267 lb 4.8 oz (121.2 kg)   BMI 44.48 kg/m   Constitutional:  Alert and oriented, No acute distress.  Laboratory Data: Lab Results  Component Value Date   WBC 5.3 02/09/2017   HGB 12.4 02/09/2017   HCT 37.9 02/09/2017   MCV 65.5 (L) 02/09/2017   PLT 257 02/09/2017    Lab Results  Component Value Date   CREATININE 0.66 02/09/2017    No results found for: PSA  No results found for: TESTOSTERONE  No results found for: HGBA1C  Urinalysis    Component Value Date/Time   COLORURINE Amber 12/02/2013 1650   APPEARANCEUR Cloudy (A) 05/31/2017 1439   LABSPEC 1.033 12/02/2013 1650   PHURINE 5.0 12/02/2013 1650   GLUCOSEU Negative 05/31/2017 1439   GLUCOSEU Negative 12/02/2013 1650   HGBUR Negative 12/02/2013 1650   BILIRUBINUR Negative 05/31/2017 1439   BILIRUBINUR Negative 12/02/2013 1650   KETONESUR Trace 12/02/2013 1650   PROTEINUR Negative 05/31/2017 1439   PROTEINUR 30 mg/dL 09/81/191412/13/2014 [id number]   NITRITE Negative 05/31/2017 1439   NITRITE Negative 12/02/2013 1650   LEUKOCYTESUR 2+ (A) 05/31/2017 1439   LEUKOCYTESUR Negative 12/02/2013 1650    Pertinent Imaging: none  Assessment & Plan:  The patient will start on Vesicare 5 mg samples and prescription and be reevaluated in 4-6 weeks. She will have the urodynamic test which I'll discuss next time and assess her response to medication.  There are no diagnoses linked to this encounter.  No Follow-up on file.  Martina SinnerMACDIARMID,Sheritta Deeg A, MD  [facility] [address], [address] [place], [place] [address] [phone]

## 2017-08-18 DIAGNOSIS — F339 Major depressive disorder, recurrent, unspecified: Secondary | ICD-10-CM | POA: Diagnosis present

## 2017-09-01 ENCOUNTER — Ambulatory Visit: Payer: Medicare Other | Admitting: Urology

## 2017-09-01 ENCOUNTER — Encounter: Payer: Self-pay | Admitting: Urology

## 2017-09-01 ENCOUNTER — Ambulatory Visit: Payer: Medicare Other

## 2017-11-08 ENCOUNTER — Emergency Department
Admission: EM | Admit: 2017-11-08 | Discharge: 2017-11-08 | Disposition: A | Discharge status: Home / Self Care | Payer: Medicare Other | Attending: Emergency Medicine | Admitting: Emergency Medicine

## 2017-11-08 ENCOUNTER — Emergency Department: Payer: Medicare Other

## 2017-11-08 DIAGNOSIS — Z79899 Other long term (current) drug therapy: Secondary | ICD-10-CM | POA: Diagnosis not present

## 2017-11-08 DIAGNOSIS — I10 Essential (primary) hypertension: Secondary | ICD-10-CM | POA: Insufficient documentation

## 2017-11-08 DIAGNOSIS — X58XXXA Exposure to other specified factors, initial encounter: Secondary | ICD-10-CM | POA: Diagnosis not present

## 2017-11-08 DIAGNOSIS — Y999 Unspecified external cause status: Secondary | ICD-10-CM | POA: Insufficient documentation

## 2017-11-08 DIAGNOSIS — Y929 Unspecified place or not applicable: Secondary | ICD-10-CM | POA: Insufficient documentation

## 2017-11-08 DIAGNOSIS — S62316A Displaced fracture of base of fifth metacarpal bone, right hand, initial encounter for closed fracture: Secondary | ICD-10-CM | POA: Insufficient documentation

## 2017-11-08 DIAGNOSIS — F17200 Nicotine dependence, unspecified, uncomplicated: Secondary | ICD-10-CM | POA: Diagnosis not present

## 2017-11-08 DIAGNOSIS — S62101A Fracture of unspecified carpal bone, right wrist, initial encounter for closed fracture: Secondary | ICD-10-CM | POA: Diagnosis not present

## 2017-11-08 DIAGNOSIS — S6991XA Unspecified injury of right wrist, hand and finger(s), initial encounter: Secondary | ICD-10-CM | POA: Diagnosis present

## 2017-11-08 DIAGNOSIS — Y939 Activity, unspecified: Secondary | ICD-10-CM | POA: Diagnosis not present

## 2017-11-08 MED ORDER — NABUMETONE 750 MG PO TABS: 750.0000 mg | ORAL_TABLET | Freq: Two times a day (BID) | ORAL | 0 refills | Status: DC

## 2017-11-08 MED ORDER — HYDROCODONE-ACETAMINOPHEN 5-325 MG PO TABS
1.0000 | ORAL_TABLET | Freq: Once | ORAL | Status: AC
  Administered 2017-11-08: 1 via ORAL
  Filled 2017-11-08: qty 1

## 2017-11-08 MED ORDER — HYDROCODONE-ACETAMINOPHEN 5-325 MG PO TABS: 1.0000 | ORAL_TABLET | Freq: Two times a day (BID) | ORAL | 0 refills | Status: DC | PRN

## 2017-11-08 NOTE — ED Triage Notes (Signed)
Patient awoke with right hand/wrist pain this morning. Patient denies injury. No redness or swelling noted on exam. Patient not able to fully flex fingers.

## 2017-11-08 NOTE — Discharge Instructions (Signed)
You have somehow sustained an small avulsion fracture to the right wrist. Wear the wrist splint as directed. Remove it regularly to apply ice compresses. Take the anti-inflammatory as directed and the pain medicine as needed. Follow-up with Dr. Stephenie AcresSoria for continued symptoms.

## 2017-11-08 NOTE — ED Notes (Signed)
Pt states she woke up this morning with an extremely painful right hand and right wrist. Pt denies any injury occurred. No history of gout. Area red and swollen; very tender to touch.

## 2017-11-09 NOTE — ED Provider Notes (Signed)
[facility] Emergency Department Provider Note ____________________________________________  Time seen: 2001  I have reviewed the triage vital signs and the nursing notes.  HISTORY  Chief Complaint  Hand Pain  HPI Kristin Burgess is a 56 y.o. female presents to the ED for evaluation of right hand and wrist pain.  Patient describes awakening this morning with acute pain, swelling, redness to the ulnar aspect of the wrist.  She denies any known injury, accident, or trauma.  She reports pain with wrist dorsiflexion and pronation.  She denies any distal paresthesias, lacerations, or other injury.  Past Medical History:  Diagnosis Date  . AVM (arteriovenous malformation) brain   . Brain bleed (HCC)   . Depression   . Hypertension     There are no active problems to display for this patient.   Past Surgical History:  Procedure Laterality Date  . BRAIN SURGERY  2015  . TUBAL LIGATION      Prior to Admission medications   Medication Sig Start Date End Date Taking? Authorizing Provider  amLODipine (NORVASC) 5 MG tablet Take by mouth. 04/02/17   [provider]  butalbital-acetaminophen-caffeine (FIORICET, ESGIC) 50-325-40 MG tablet Take 1-2 tablets by mouth every 6 (six) hours as needed for headache. Patient not taking: Reported on 05/31/2017 02/09/17 02/09/18  Merrily Brittleifenbark, Neil, MD  citalopram (CELEXA) 10 MG tablet Take by mouth. 03/29/17 03/29/18  [provider]  HYDROcodone-acetaminophen (NORCO) 5-325 MG tablet Take 1 tablet 2 (two) times daily as needed by mouth. 11/08/17   Jasiri Hanawalt, Charlesetta IvoryJenise V Bacon, PA-C  nabumetone (RELAFEN) 750 MG tablet Take 1 tablet (750 mg total) 2 (two) times daily by mouth. 11/08/17   Mahoganie Basher, Charlesetta IvoryJenise V Bacon, PA-C  solifenacin (VESICARE) 5 MG tablet Take 1 tablet (5 mg total) by mouth daily. 07/12/17   Alfredo MartinezMacDiarmid, Scott, MD    Allergies Latex  Family History  Problem Relation Age of Onset  . Lung cancer Maternal Grandmother    . Colon cancer Maternal Grandfather   . Kidney cancer Neg Hx   . Bladder Cancer Neg Hx     Social History Social History   Tobacco Use  . Smoking status: Current Every Day Smoker  . Smokeless tobacco: Never Used  Substance Use Topics  . Alcohol use: No  . Drug use: No    Review of Systems  Constitutional: Negative for fever. Cardiovascular: Negative for chest pain. Respiratory: Negative for shortness of breath. Musculoskeletal: Negative for back pain. Right wrist pain as above Skin: Negative for rash. Neurological: Negative for headaches, focal weakness or numbness. ____________________________________________  PHYSICAL EXAM:  VITAL SIGNS: ED Triage Vitals  Enc Vitals Group     BP 11/08/17 1925 (!) 163/92     Pulse Rate 11/08/17 1925 77     Resp 11/08/17 1925 20     Temp 11/08/17 1925 99 F (37.2 C)     Temp src --      SpO2 11/08/17 1925 97 %     Weight 11/08/17 1927 267 lb (121.1 kg)     Height --      Head Circumference --      Peak Flow --      Pain Score 11/08/17 1927 10     Pain Loc --      Pain Edu? --      Excl. in GC? --     Constitutional: Alert and oriented. Well appearing and in no distress. Head: Normocephalic and atraumatic. Cardiovascular:  Normal distal pulses. Respiratory: Normal  respiratory effort.  Musculoskeletal: Normal composite fist. Subtle swelling and erythema noted to the dorsolateral wrist.  Nontender with normal range of motion in all extremities.  Neurologic: Normal gross sensation. Normal gait without ataxia. Normal speech and language. No gross focal neurologic deficits are appreciated. Skin:  Skin is warm, dry and intact. No rash noted. ___________________________________________   RADIOLOGY  Right Hand/Wrist  FINDINGS: Small faint radiopaque focus at the base of the fifth metacarpal is concerning for an acute avulsion fracture. Correlation with clinical exam and point tenderness recommended. No other acute  fracture noted. There is no dislocation. There is mild soft tissue swelling over the dorsum of the wrist.  IMPRESSION: Findings concerning for a small avulsion fracture from the the base of the fifth metacarpal. Clinical correlation is recommended. ____________________________________________  PROCEDURES  Procedures Wrist cock-up splint ____________________________________________  INITIAL IMPRESSION / ASSESSMENT AND PLAN / ED COURSE  Patient will need evaluation of sudden swelling and pain to the dorsal lateral right hand without known injury.  Patient awoke with symptoms but is unaware of any preceding events.  She was found to have a suspicious appearing avulsion to the x-ray films.  This correlates with her tenderness, pain, and swelling.  She is placed in a Velcro wrist cockup splint for support.  She will follow-up with Ortho-hand for further fracture management.  Prescription for hydrocodone (#10) and Relafen are provided for symptom relief. ____________________________________________  FINAL CLINICAL IMPRESSION(S) / ED DIAGNOSES  Final diagnoses:  Closed displaced fracture of base of fifth metacarpal bone of right hand, initial encounter  Avulsion fracture of right wrist      Karmen StabsMenshew, Charlesetta IvoryJenise V Bacon, PA-C 11/09/17 0010    Sharman CheekStafford, Phillip, MD 11/09/17 [phone]

## 2020-06-29 ENCOUNTER — Emergency Department
Admission: EM | Admit: 2020-06-29 | Discharge: 2020-06-29 | Disposition: A | Discharge status: Home / Self Care | Payer: Medicare Other | Attending: Emergency Medicine | Admitting: Emergency Medicine

## 2020-06-29 ENCOUNTER — Emergency Department: Payer: Medicare Other

## 2020-06-29 ENCOUNTER — Other Ambulatory Visit: Payer: Self-pay

## 2020-06-29 ENCOUNTER — Encounter: Payer: Self-pay | Admitting: Intensive Care

## 2020-06-29 DIAGNOSIS — R0602 Shortness of breath: Secondary | ICD-10-CM | POA: Insufficient documentation

## 2020-06-29 DIAGNOSIS — Z5321 Procedure and treatment not carried out due to patient leaving prior to being seen by health care provider: Secondary | ICD-10-CM | POA: Diagnosis not present

## 2020-06-29 DIAGNOSIS — R079 Chest pain, unspecified: Secondary | ICD-10-CM | POA: Insufficient documentation

## 2020-06-29 HISTORY — DX: Hyperlipidemia, unspecified: E78.5

## 2020-06-29 LAB — CBC
HCT: 40.2 % (ref 36.0–46.0)
Hemoglobin: 12.2 g/dL (ref 12.0–15.0)
MCH: 20.7 pg — ABNORMAL LOW (ref 26.0–34.0)
MCHC: 30.3 g/dL (ref 30.0–36.0)
MCV: 68.4 fL — ABNORMAL LOW (ref 80.0–100.0)
Platelets: 263 10*3/uL (ref 150–400)
RBC: 5.88 MIL/uL — ABNORMAL HIGH (ref 3.87–5.11)
RDW: 16.8 % — ABNORMAL HIGH (ref 11.5–15.5)
WBC: 5.7 10*3/uL (ref 4.0–10.5)
nRBC: 0 % (ref 0.0–0.2)

## 2020-06-29 LAB — BASIC METABOLIC PANEL
Anion gap: 5 (ref 5–15)
BUN: 15 mg/dL (ref 6–20)
CO2: 27 mmol/L (ref 22–32)
Calcium: 8.7 mg/dL — ABNORMAL LOW (ref 8.9–10.3)
Chloride: 108 mmol/L (ref 98–111)
Creatinine, Ser: 1.07 mg/dL — ABNORMAL HIGH (ref 0.44–1.00)
GFR calc Af Amer: >60 mL/min (ref >60)
GFR calc non Af Amer: 57 mL/min — ABNORMAL LOW (ref >60)
Glucose, Bld: 86 mg/dL (ref 70–99)
Potassium: 4.9 mmol/L (ref 3.5–5.1)
Sodium: 140 mmol/L (ref 135–145)

## 2020-06-29 LAB — TROPONIN I (HIGH SENSITIVITY): Troponin I (High Sensitivity): 6 ng/L (ref <18)

## 2020-06-29 NOTE — ED Triage Notes (Signed)
Pt c/o sob and chest tightness since Monday. Radiates down left arm. Negative covid test on Tuesday. Patient unable to speak in complete sentences in triage. Oxygen levels read 99% in triage

## 2020-06-29 NOTE — ED Notes (Signed)
Patient placed on 2L O2 for comfort in triage

## 2021-04-10 ENCOUNTER — Emergency Department
Admission: EM | Admit: 2021-04-10 | Discharge: 2021-04-11 | Disposition: A | Discharge status: Home / Self Care | Payer: Medicare Other | Attending: Emergency Medicine | Admitting: Emergency Medicine

## 2021-04-10 ENCOUNTER — Other Ambulatory Visit: Payer: Self-pay

## 2021-04-10 ENCOUNTER — Encounter: Payer: Self-pay | Admitting: Emergency Medicine

## 2021-04-10 DIAGNOSIS — M5432 Sciatica, left side: Secondary | ICD-10-CM | POA: Diagnosis not present

## 2021-04-10 DIAGNOSIS — M79604 Pain in right leg: Secondary | ICD-10-CM

## 2021-04-10 DIAGNOSIS — F17210 Nicotine dependence, cigarettes, uncomplicated: Secondary | ICD-10-CM | POA: Diagnosis not present

## 2021-04-10 DIAGNOSIS — M5431 Sciatica, right side: Secondary | ICD-10-CM | POA: Insufficient documentation

## 2021-04-10 DIAGNOSIS — Z91040 Latex allergy status: Secondary | ICD-10-CM | POA: Diagnosis not present

## 2021-04-10 DIAGNOSIS — I10 Essential (primary) hypertension: Secondary | ICD-10-CM | POA: Diagnosis not present

## 2021-04-10 DIAGNOSIS — Z79899 Other long term (current) drug therapy: Secondary | ICD-10-CM | POA: Insufficient documentation

## 2021-04-10 NOTE — ED Triage Notes (Signed)
Pt in via POV, reports worsening bilateral leg pain x approximately 3 months.  States, "I cannot walk far without having to sit down and then have shooting pains up both of my legs."  Reports seeing PCP, stating she needs an Xray but was not sent for one.    NAD noted at this time.

## 2021-04-11 ENCOUNTER — Emergency Department: Payer: Medicare Other

## 2021-04-11 DIAGNOSIS — M5431 Sciatica, right side: Secondary | ICD-10-CM | POA: Diagnosis not present

## 2021-04-11 MED ORDER — CYCLOBENZAPRINE HCL 10 MG PO TABS: 10.0000 mg | ORAL_TABLET | Freq: Three times a day (TID) | ORAL | 0 refills | Status: AC | PRN

## 2021-04-11 MED ORDER — PREDNISONE 10 MG (21) PO TBPK: ORAL_TABLET | ORAL | 0 refills | Status: DC

## 2021-04-11 MED ORDER — PREDNISONE 20 MG PO TABS
60.0000 mg | ORAL_TABLET | Freq: Once | ORAL | Status: AC
  Administered 2021-04-11: 60 mg via ORAL
  Filled 2021-04-11: qty 3

## 2021-04-12 NOTE — ED Provider Notes (Signed)
[facility] Emergency Department Provider Note   ____________________________________________   Event Date/Time   First MD Initiated Contact with Patient 04/11/21 0031     (approximate)  I have reviewed the triage vital signs and the nursing notes.   HISTORY  Chief Complaint Leg Pain    HPI Kristin Burgess is a 60 y.o. female with the below stated past medical history who presents for bilateral lower extremity pain that has been worsening over the last 3 months.  Patient states that she is having shooting sharp pains down both legs that causes her to be unable to walk for long distances.  Patient states that she was seen by her primary care doctor recently who states that she needed imaging of her back but has not sent her for any imaging.  Patient states that that is what she is here for.  Denies any known history of sciatica.  Patient currently denies any vision changes, tinnitus, difficulty speaking, facial droop, sore throat, chest pain, shortness of breath, abdominal pain, nausea/vomiting/diarrhea, dysuria, or weakness/numbness in any extremity         Past Medical History:  Diagnosis Date  . AVM (arteriovenous malformation) brain   . Brain bleed (HCC)   . Depression   . Hyperlipidemia   . Hypertension     There are no problems to display for this patient.   Past Surgical History:  Procedure Laterality Date  . BRAIN SURGERY  2015  . TUBAL LIGATION      Prior to Admission medications   Medication Sig Start Date End Date Taking? Authorizing Provider  cyclobenzaprine (FLEXERIL) 10 MG tablet Take 1 tablet (10 mg total) by mouth 3 (three) times daily as needed for up to 4 days for muscle spasms (left chest pain). 04/11/21 04/15/21 Yes Merwyn Katos, MD  predniSONE (STERAPRED UNI-PAK 21 TAB) 10 MG (21) TBPK tablet As directed 04/11/21  Yes Chirstine Defrain, Clent Jacks, MD  amLODipine (NORVASC) 5 MG tablet Take by mouth. 04/02/17   [provider]   citalopram (CELEXA) 10 MG tablet Take by mouth. 03/29/17 03/29/18  [provider]  HYDROcodone-acetaminophen (NORCO) 5-325 MG tablet Take 1 tablet 2 (two) times daily as needed by mouth. 11/08/17   Menshew, Charlesetta Ivory, PA-C  nabumetone (RELAFEN) 750 MG tablet Take 1 tablet (750 mg total) 2 (two) times daily by mouth. 11/08/17   Menshew, Charlesetta Ivory, PA-C  solifenacin (VESICARE) 5 MG tablet Take 1 tablet (5 mg total) by mouth daily. 07/12/17   Alfredo Martinez, MD    Allergies Latex  Family History  Problem Relation Age of Onset  . Lung cancer Maternal Grandmother   . Colon cancer Maternal Grandfather   . Kidney cancer Neg Hx   . Bladder Cancer Neg Hx     Social History Social History   Tobacco Use  . Smoking status: Current Every Day Smoker    Packs/day: 0.25    Types: Cigarettes  . Smokeless tobacco: Never Used  Vaping Use  . Vaping Use: Never used  Substance Use Topics  . Alcohol use: No  . Drug use: No    Review of Systems Constitutional: No fever/chills Eyes: No visual changes. ENT: No sore throat. Cardiovascular: Denies chest pain. Respiratory: Denies shortness of breath. Gastrointestinal: No abdominal pain.  No nausea, no vomiting.  No diarrhea. Genitourinary: Negative for dysuria. Musculoskeletal: Negative for acute arthralgias Skin: Negative for rash. Neurological: Negative for headaches, endorses bilateral lower extremity paresthesias/weakness/numbness without any bowel/bladder incontinence  Psychiatric: Negative for suicidal ideation/homicidal ideation   ____________________________________________   PHYSICAL EXAM:  VITAL SIGNS: ED Triage Vitals  Enc Vitals Group     BP 04/10/21 2133 (!) 164/100     Pulse Rate 04/10/21 2133 87     Resp 04/10/21 2133 19     Temp 04/10/21 2133 98.8 F (37.1 C)     Temp Source 04/10/21 2133 Oral     SpO2 04/10/21 2133 98 %     Weight 04/10/21 2138 260 lb (117.9 kg)     Height 04/10/21 2138 5\' 5"   (1.651 m)     Head Circumference --      Peak Flow --      Pain Score 04/10/21 2137 8     Pain Loc --      Pain Edu? --      Excl. in GC? --    Constitutional: Alert and oriented. Well appearing and in no acute distress. Eyes: Conjunctivae are normal. PERRL. Head: Atraumatic. Nose: No congestion/rhinnorhea. Mouth/Throat: Mucous membranes are moist. Neck: No stridor Cardiovascular: Grossly normal heart sounds.  Good peripheral circulation. Respiratory: Normal respiratory effort.  No retractions. Gastrointestinal: Soft and nontender. No distention. Musculoskeletal: No obvious deformities Neurologic:  Normal speech and language. No gross focal neurologic deficits are appreciated.  Positive straight leg raise bilaterally Skin:  Skin is warm and dry. No rash noted. Psychiatric: Mood and affect are normal. Speech and behavior are normal.  ____________________________________________   LABS (all labs ordered are listed, but only abnormal results are displayed)  Labs Reviewed - No data to display  RADIOLOGY  ED MD interpretation: CT of the lumbar spine shows no evidence of acute abnormalities  Official radiology report(s): No results found.  ____________________________________________   PROCEDURES  Procedure(s) performed (including Critical Care):  .1-3 Lead EKG Interpretation Performed by: [id number], MD Authorized by: Merwyn Katos, MD     Interpretation: normal     ECG rate:  79   ECG rate assessment: normal     Rhythm: sinus rhythm     Ectopy: none     Conduction: normal       ____________________________________________   INITIAL IMPRESSION / ASSESSMENT AND PLAN / ED COURSE  As part of my medical decision making, I reviewed the following data within the electronic MEDICAL RECORD NUMBER Nursing notes reviewed and incorporated, Labs reviewed, EKG interpreted, Old chart reviewed, Radiograph reviewed and Notes from prior ED visits reviewed and  incorporated     This patient presents with back pain most consistent with sciatica. Differential diagnoses includes lumbago versus musculoskeletal spasm / strain versus sciatica. No back pain red flags on history or physical. Presentation not consistent with malignancy (lack of history of malignancy, lack of B symptoms), fracture (no trauma, no bony tenderness to palpation), cauda equina (no bowel or urinary incontinence/retention, no saddle anesthesia, no distal weakness), AAA, viscus perforation , pulmonary embolism, renal colic, pyelonephritis (afebrile, no CVAT, no urinary symptoms). Given the clinical picture, and lack of previous imaging, patient will benefit from a CT of the lumbar spine  Plan: CT lumbar, pain control, supportive care, reassess CT lumbar shows no evidence of acute abnormalities Reassess: The patient has been reexamined and is ready to be discharged.  All diagnostic results have been reviewed and discussed with the patient/family.  Care plan has been outlined and the patient/family understands all current diagnoses, results, and treatment plans.  There are no new complaints, changes, or physical findings at this time.  All  questions have been addressed and answered. All medications, if any, that were given while in the emergency department or any that are being prescribed have been reviewed with the patient/family.  All side effects and adverse reactions have been explained.  Patient was instructed to, and agrees to follow-up with their primary care physician as well as return to the emergency department if any new or worsening symptoms develop.  Dispo: Discharge home     ____________________________________________   FINAL CLINICAL IMPRESSION(S) / ED DIAGNOSES  Final diagnoses:  Bilateral sciatica  Pain in both lower extremities     ED Discharge Orders         Ordered    predniSONE (STERAPRED UNI-PAK 21 TAB) 10 MG (21) TBPK tablet        04/11/21 0322     cyclobenzaprine (FLEXERIL) 10 MG tablet  3 times daily PRN        04/11/21 0322           Note:  This document was prepared using Dragon voice recognition software and may include unintentional dictation errors.   Merwyn Katos, MD 04/12/21 [phone]

## 2021-11-22 ENCOUNTER — Inpatient Hospital Stay: Payer: Medicare Other

## 2021-11-22 ENCOUNTER — Emergency Department: Payer: Medicare Other

## 2021-11-22 ENCOUNTER — Other Ambulatory Visit: Payer: Self-pay

## 2021-11-22 ENCOUNTER — Inpatient Hospital Stay
Admission: EM | Admit: 2021-11-22 | Discharge: 2021-11-26 | DRG: 065 | Disposition: A | Discharge status: Skilled Nursing Facility | Payer: Medicare Other | Attending: Student in an Organized Health Care Education/Training Program | Admitting: Student in an Organized Health Care Education/Training Program

## 2021-11-22 DIAGNOSIS — I6389 Other cerebral infarction: Secondary | ICD-10-CM | POA: Diagnosis not present

## 2021-11-22 DIAGNOSIS — I10 Essential (primary) hypertension: Secondary | ICD-10-CM | POA: Diagnosis present

## 2021-11-22 DIAGNOSIS — Z91040 Latex allergy status: Secondary | ICD-10-CM

## 2021-11-22 DIAGNOSIS — Z8774 Personal history of (corrected) congenital malformations of heart and circulatory system: Secondary | ICD-10-CM

## 2021-11-22 DIAGNOSIS — G2581 Restless legs syndrome: Secondary | ICD-10-CM | POA: Diagnosis present

## 2021-11-22 DIAGNOSIS — Z6841 Body Mass Index (BMI) 40.0 and over, adult: Secondary | ICD-10-CM | POA: Diagnosis not present

## 2021-11-22 DIAGNOSIS — E785 Hyperlipidemia, unspecified: Secondary | ICD-10-CM | POA: Diagnosis present

## 2021-11-22 DIAGNOSIS — N3281 Overactive bladder: Secondary | ICD-10-CM | POA: Diagnosis present

## 2021-11-22 DIAGNOSIS — Z800 Family history of malignant neoplasm of digestive organs: Secondary | ICD-10-CM

## 2021-11-22 DIAGNOSIS — Z720 Tobacco use: Secondary | ICD-10-CM | POA: Diagnosis present

## 2021-11-22 DIAGNOSIS — I639 Cerebral infarction, unspecified: Principal | ICD-10-CM

## 2021-11-22 DIAGNOSIS — Z823 Family history of stroke: Secondary | ICD-10-CM

## 2021-11-22 DIAGNOSIS — F339 Major depressive disorder, recurrent, unspecified: Secondary | ICD-10-CM | POA: Diagnosis present

## 2021-11-22 DIAGNOSIS — Z634 Disappearance and death of family member: Secondary | ICD-10-CM

## 2021-11-22 DIAGNOSIS — R531 Weakness: Secondary | ICD-10-CM | POA: Diagnosis present

## 2021-11-22 DIAGNOSIS — W06XXXA Fall from bed, initial encounter: Secondary | ICD-10-CM | POA: Diagnosis present

## 2021-11-22 DIAGNOSIS — G40909 Epilepsy, unspecified, not intractable, without status epilepticus: Secondary | ICD-10-CM | POA: Diagnosis present

## 2021-11-22 DIAGNOSIS — G9389 Other specified disorders of brain: Secondary | ICD-10-CM | POA: Diagnosis present

## 2021-11-22 DIAGNOSIS — Z79899 Other long term (current) drug therapy: Secondary | ICD-10-CM

## 2021-11-22 DIAGNOSIS — G629 Polyneuropathy, unspecified: Secondary | ICD-10-CM | POA: Diagnosis present

## 2021-11-22 DIAGNOSIS — Z801 Family history of malignant neoplasm of trachea, bronchus and lung: Secondary | ICD-10-CM

## 2021-11-22 DIAGNOSIS — R29703 NIHSS score 3: Secondary | ICD-10-CM | POA: Diagnosis present

## 2021-11-22 DIAGNOSIS — F17210 Nicotine dependence, cigarettes, uncomplicated: Secondary | ICD-10-CM | POA: Diagnosis present

## 2021-11-22 DIAGNOSIS — I69351 Hemiplegia and hemiparesis following cerebral infarction affecting right dominant side: Secondary | ICD-10-CM | POA: Diagnosis not present

## 2021-11-22 DIAGNOSIS — Z87898 Personal history of other specified conditions: Secondary | ICD-10-CM

## 2021-11-22 DIAGNOSIS — Z9851 Tubal ligation status: Secondary | ICD-10-CM

## 2021-11-22 DIAGNOSIS — Z20822 Contact with and (suspected) exposure to covid-19: Secondary | ICD-10-CM | POA: Diagnosis present

## 2021-11-22 DIAGNOSIS — R29898 Other symptoms and signs involving the musculoskeletal system: Secondary | ICD-10-CM | POA: Diagnosis not present

## 2021-11-22 LAB — BASIC METABOLIC PANEL
Anion gap: 6 (ref 5–15)
BUN: 13 mg/dL (ref 6–20)
CO2: 26 mmol/L (ref 22–32)
Calcium: 8.7 mg/dL — ABNORMAL LOW (ref 8.9–10.3)
Chloride: 107 mmol/L (ref 98–111)
Creatinine, Ser: 0.71 mg/dL (ref 0.44–1.00)
GFR, Estimated: >60 mL/min (ref >60)
Glucose, Bld: 107 mg/dL — ABNORMAL HIGH (ref 70–99)
Potassium: 3.8 mmol/L (ref 3.5–5.1)
Sodium: 139 mmol/L (ref 135–145)

## 2021-11-22 LAB — CBC
HCT: 42.4 % (ref 36.0–46.0)
Hemoglobin: 12.9 g/dL (ref 12.0–15.0)
MCH: 21.3 pg — ABNORMAL LOW (ref 26.0–34.0)
MCHC: 30.4 g/dL (ref 30.0–36.0)
MCV: 70 fL — ABNORMAL LOW (ref 80.0–100.0)
Platelets: 241 10*3/uL (ref 150–400)
RBC: 6.06 MIL/uL — ABNORMAL HIGH (ref 3.87–5.11)
RDW: 16.6 % — ABNORMAL HIGH (ref 11.5–15.5)
WBC: 5.1 10*3/uL (ref 4.0–10.5)
nRBC: 0 % (ref 0.0–0.2)

## 2021-11-22 LAB — LIPID PANEL
Cholesterol: 152 mg/dL (ref 0–200)
HDL: 57 mg/dL (ref >40)
LDL Cholesterol: 87 mg/dL (ref 0–99)
Total CHOL/HDL Ratio: 2.7 RATIO
Triglycerides: 39 mg/dL (ref <150)
VLDL: 8 mg/dL (ref 0–40)

## 2021-11-22 LAB — PROTIME-INR
INR: 1.1 (ref 0.8–1.2)
Prothrombin Time: 13.8 seconds (ref 11.4–15.2)

## 2021-11-22 LAB — ETHANOL: Alcohol, Ethyl (B): <10 mg/dL (ref <10)

## 2021-11-22 LAB — RESP PANEL BY RT-PCR (FLU A&B, COVID) ARPGX2
Influenza A by PCR: NEGATIVE
Influenza B by PCR: NEGATIVE
SARS Coronavirus 2 by RT PCR: NEGATIVE

## 2021-11-22 LAB — HEPATIC FUNCTION PANEL
ALT: 15 U/L (ref 0–44)
AST: 17 U/L (ref 15–41)
Albumin: 3.7 g/dL (ref 3.5–5.0)
Alkaline Phosphatase: 68 U/L (ref 38–126)
Bilirubin, Direct: <0.1 mg/dL (ref 0.0–0.2)
Total Bilirubin: 0.4 mg/dL (ref 0.3–1.2)
Total Protein: 7.2 g/dL (ref 6.5–8.1)

## 2021-11-22 LAB — APTT: aPTT: 30 seconds (ref 24–36)

## 2021-11-22 MED ORDER — ENOXAPARIN SODIUM 40 MG/0.4ML IJ SOSY
40.0000 mg | PREFILLED_SYRINGE | INTRAMUSCULAR | Status: DC
  Administered 2021-11-22 – 2021-11-25 (×4): 40 mg via SUBCUTANEOUS
  Filled 2021-11-22 (×4): qty 0.4

## 2021-11-22 MED ORDER — AMLODIPINE BESYLATE 10 MG PO TABS
10.0000 mg | ORAL_TABLET | Freq: Every day | ORAL | Status: DC
  Administered 2021-11-22 – 2021-11-25 (×4): 10 mg via ORAL
  Filled 2021-11-22 (×5): qty 1

## 2021-11-22 MED ORDER — HYDROCHLOROTHIAZIDE 12.5 MG PO TABS
12.5000 mg | ORAL_TABLET | Freq: Every day | ORAL | Status: DC
  Administered 2021-11-23 – 2021-11-25 (×3): 12.5 mg via ORAL
  Filled 2021-11-22 (×4): qty 1

## 2021-11-22 MED ORDER — STROKE: EARLY STAGES OF RECOVERY BOOK: Freq: Once | Status: AC

## 2021-11-22 MED ORDER — ATORVASTATIN CALCIUM 20 MG PO TABS
40.0000 mg | ORAL_TABLET | Freq: Every day | ORAL | Status: DC
  Administered 2021-11-22 – 2021-11-23 (×2): 40 mg via ORAL
  Filled 2021-11-22 (×2): qty 2

## 2021-11-22 MED ORDER — ACETAMINOPHEN 325 MG PO TABS: 650.0000 mg | ORAL_TABLET | ORAL | Status: DC | PRN

## 2021-11-22 MED ORDER — DULOXETINE HCL 30 MG PO CPEP
30.0000 mg | ORAL_CAPSULE | Freq: Every day | ORAL | Status: DC
  Administered 2021-11-23 – 2021-11-26 (×4): 30 mg via ORAL
  Filled 2021-11-22 (×4): qty 1

## 2021-11-22 MED ORDER — PROPRANOLOL HCL 10 MG PO TABS
10.0000 mg | ORAL_TABLET | Freq: Two times a day (BID) | ORAL | Status: DC
  Administered 2021-11-22 – 2021-11-26 (×8): 10 mg via ORAL
  Filled 2021-11-22 (×9): qty 1

## 2021-11-22 MED ORDER — LAMOTRIGINE ER 100 MG PO TB24
100.0000 mg | ORAL_TABLET | Freq: Every day | ORAL | Status: DC
  Filled 2021-11-22: qty 1

## 2021-11-22 MED ORDER — ACETAMINOPHEN 160 MG/5ML PO SOLN
650.0000 mg | ORAL | Status: DC | PRN
  Filled 2021-11-22: qty 20.3

## 2021-11-22 MED ORDER — GABAPENTIN 300 MG PO CAPS
300.0000 mg | ORAL_CAPSULE | Freq: Every evening | ORAL | Status: DC
  Administered 2021-11-22 – 2021-11-25 (×4): 300 mg via ORAL
  Filled 2021-11-22 (×4): qty 1

## 2021-11-22 MED ORDER — LAMOTRIGINE ER 100 MG PO TB24
100.0000 mg | ORAL_TABLET | Freq: Every day | ORAL | Status: DC
  Administered 2021-11-23 – 2021-11-25 (×3): 100 mg via ORAL
  Filled 2021-11-22 (×5): qty 1

## 2021-11-22 MED ORDER — DARIFENACIN HYDROBROMIDE ER 7.5 MG PO TB24
15.0000 mg | ORAL_TABLET | Freq: Every day | ORAL | Status: DC
  Administered 2021-11-23 – 2021-11-26 (×4): 15 mg via ORAL
  Filled 2021-11-22 (×4): qty 2

## 2021-11-22 MED ORDER — TRAZODONE HCL 50 MG PO TABS
50.0000 mg | ORAL_TABLET | Freq: Every day | ORAL | Status: DC
  Administered 2021-11-22 – 2021-11-25 (×4): 50 mg via ORAL
  Filled 2021-11-22 (×4): qty 1

## 2021-11-22 MED ORDER — IOHEXOL 350 MG/ML SOLN
75.0000 mL | Freq: Once | INTRAVENOUS | Status: AC | PRN
  Administered 2021-11-22: 75 mL via INTRAVENOUS

## 2021-11-22 MED ORDER — ACETAMINOPHEN 650 MG RE SUPP: 650.0000 mg | RECTAL | Status: DC | PRN

## 2021-11-22 MED ORDER — DIAZEPAM 2 MG PO TABS
2.0000 mg | ORAL_TABLET | Freq: Once | ORAL | Status: AC
  Administered 2021-11-22: 2 mg via ORAL
  Filled 2021-11-22 (×2): qty 1

## 2021-11-22 NOTE — ED Triage Notes (Signed)
Arrived by EMS from home for right leg weakness since Thanksgiving. HX HTN, anxiety and high cholesterol. EMS vitals 160/90 blood pressure, 96HR, 97% O2. EMS reports patient was unable to put weight on right leg and ambulate

## 2021-11-22 NOTE — Progress Notes (Signed)
SLP Cancellation Note  Patient Details Name: Kristin Burgess MRN: [id number] DOB: Oct 16, 1961   Cancelled treatment:       Reason Eval/Treat Not Completed: SLP screened, no needs identified, will sign off  Emili Mcloughlin B. Dreama Saa M.S., CCC-SLP, [facility] Speech-Language Pathologist Rehabilitation Services Office [phone]  Reuel Derby 11/22/2021, 3:53 PM

## 2021-11-22 NOTE — Progress Notes (Signed)
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   Consult placed and secure chat sent to on call Neurologist for consult.

## 2021-11-22 NOTE — ED Triage Notes (Addendum)
Pt to ED [facility] from home for right leg weakness since thanksgiving. States today fell d/t weakness. Denies hitting head or blood thinners with fall. Pt able to move right leg without difficulty.  Pt playing music and dancing in triage.   Pt refusing CT, pt states "Oh no I aint doing that"

## 2021-11-22 NOTE — ED Notes (Signed)
Repeat labs sent.

## 2021-11-22 NOTE — ED Notes (Signed)
Patient eating bojangles in lobby while waiting for a ED room

## 2021-11-22 NOTE — ED Notes (Signed)
Pt to MRI with MRI Tech.

## 2021-11-22 NOTE — ED Provider Notes (Signed)
[facility] Emergency Department Provider Note  ____________________________________________   Event Date/Time   First MD Initiated Contact with Patient 11/22/21 1058     (approximate)  I have reviewed the triage vital signs and the nursing notes.   HISTORY  Chief Complaint Weakness   HPI Kristin Burgess is a 60 y.o. female with above-noted history presents for assessment of right leg weakness.  Patient states this started around 11/29.  She states she fell today due to weakness in the right leg onto the right back hip.  She denies any associated pain stating it just feels "heavy".  She denies any left lower extremity symptoms or any back pain or upper extremity symptoms, headache, earache, sore throat, fevers, chills, vomiting diarrhea rash upper urination, incontinence or other associated sick symptoms.  No other concerns at this time.         Past Medical History:  Diagnosis Date   AVM (arteriovenous malformation) brain    Brain bleed (HCC)    Depression    Hyperlipidemia    Hypertension     Patient Active Problem List   Diagnosis Date Noted   Leg weakness 11/22/2021   Major depressive disorder, recurrent episode with mixed features (HCC) 08/18/2017   Urinary incontinence 05/24/2017   History of seizures 04/02/2017   Mild obstructive sleep apnea 01/14/2016   Pseudobulbar affect 10/25/2015   Restless leg syndrome 10/25/2015   Essential hypertension 05/16/2015   Tobacco abuse 05/16/2015   Morbid obesity (HCC) 09/07/2014   H/O arteriovenous malformation (AVM) 11/09/2013     Past Surgical History:  Procedure Laterality Date   BRAIN SURGERY  2015   TUBAL LIGATION      Prior to Admission medications   Medication Sig Start Date End Date Taking? Authorizing Provider  amLODipine (NORVASC) 5 MG tablet Take by mouth. 04/02/17   [provider]  citalopram (CELEXA) 10 MG tablet Take by mouth. 03/29/17 03/29/18  [provider]   HYDROcodone-acetaminophen (NORCO) 5-325 MG tablet Take 1 tablet 2 (two) times daily as needed by mouth. 11/08/17   Menshew, Charlesetta Ivory, PA-C  nabumetone (RELAFEN) 750 MG tablet Take 1 tablet (750 mg total) 2 (two) times daily by mouth. 11/08/17   Menshew, Charlesetta Ivory, PA-C  predniSONE (STERAPRED UNI-PAK 21 TAB) 10 MG (21) TBPK tablet As directed 04/11/21   Merwyn Katos, MD  solifenacin (VESICARE) 5 MG tablet Take 1 tablet (5 mg total) by mouth daily. 07/12/17   Alfredo Martinez, MD    Allergies Latex  Family History  Problem Relation Age of Onset   Lung cancer Maternal Grandmother    Colon cancer Maternal Grandfather    Kidney cancer Neg Hx    Bladder Cancer Neg Hx     Social History Social History   Tobacco Use   Smoking status: Every Day    Packs/day: 0.25    Types: Cigarettes   Smokeless tobacco: Never  Vaping Use   Vaping Use: Never used  Substance Use Topics   Alcohol use: No   Drug use: No    Review of Systems  Review of Systems  Constitutional:  Negative for chills and fever.  HENT:  Negative for sore throat.   Eyes:  Negative for pain.  Respiratory:  Negative for cough and stridor.   Cardiovascular:  Negative for chest pain.  Gastrointestinal:  Negative for vomiting.  Genitourinary:  Negative for dysuria.  Musculoskeletal:  Negative for myalgias.  Skin:  Negative for rash.  Neurological:  Positive for focal weakness (R leg). Negative for seizures, loss of consciousness and headaches.  Psychiatric/Behavioral:  Negative for suicidal ideas.   All other systems reviewed and are negative.    ____________________________________________   PHYSICAL EXAM:  VITAL SIGNS: ED Triage Vitals [11/22/21 0924]  Enc Vitals Group     BP (!) 151/91     Pulse Rate 73     Resp 18     Temp 98.2 F (36.8 C)     Temp Source Oral     SpO2 96 %     Weight 264 lb (119.7 kg)     Height 5\' 5"  (1.651 m)     Head Circumference      Peak Flow      Pain Score 0      Pain Loc      Pain Edu?      Excl. in GC?    Vitals:   11/22/21 0924  BP: (!) 151/91  Pulse: 73  Resp: 18  Temp: 98.2 F (36.8 C)  SpO2: 96%   Physical Exam Vitals and nursing note reviewed.  Constitutional:      General: She is not in acute distress.    Appearance: She is well-developed.  HENT:     Head: Normocephalic and atraumatic.  Eyes:     Conjunctiva/sclera: Conjunctivae normal.  Cardiovascular:     Rate and Rhythm: Normal rate and regular rhythm.     Heart sounds: No murmur heard. Pulmonary:     Effort: Pulmonary effort is normal. No respiratory distress.     Breath sounds: Normal breath sounds.  Abdominal:     Palpations: Abdomen is soft.     Tenderness: There is no abdominal tenderness.  Musculoskeletal:        General: No swelling.     Cervical back: Neck supple.  Skin:    General: Skin is warm and dry.     Capillary Refill: Capillary refill takes less than 2 seconds.  Neurological:     Mental Status: She is alert.  Psychiatric:        Mood and Affect: Mood normal.    Patient is slightly weaker on right hip flexion extension but does not have any significant pain on passive range of motion.  She can have full strength of the right hip flexion extension as well as right ankle.  She has full strength of the left lower extremity and bilateral upper extremities.  There is no pronator drift finger dysmetria.  Cranial nerves II through XII are grossly intact.  2+ radial pulses.  There is no tenderness step-offs as well as over the C process T/L-spine. ____________________________________________   LABS (all labs ordered are listed, but only abnormal results are displayed)  Labs Reviewed  BASIC METABOLIC PANEL - Abnormal; Notable for the following components:      Result Value   Glucose, Bld 107 (*)    Calcium 8.7 (*)    All other components within normal limits  CBC - Abnormal; Notable for the following components:   RBC 6.06 (*)    MCV 70.0 (*)    MCH  21.3 (*)    RDW 16.6 (*)    All other components within normal limits  RESP PANEL BY RT-PCR (FLU A&B, COVID) ARPGX2  ETHANOL  PROTIME-INR  APTT  HEPATIC FUNCTION PANEL  LIPID PANEL  URINALYSIS, ROUTINE W REFLEX MICROSCOPIC  HEMOGLOBIN A1C   ____________________________________________  EKG  ECG shows sinus rhythm with PAC and ventricular rate of  72 with some nonspecific changes and artifacts throughout otherwise unremarkable. ____________________________________________  RADIOLOGY  ED MD interpretation: CT head remarkable for subtle hypodensity of the left white matter in the left parietal lobe concerning for subacute to chronic infarct.  There is some unchanged encephalomalacia of the right parietal vertex no evidence of hemorrhage, mass-effect or other acute process.  CTA head and neck shows no large vessel occlusion or significant stenosis in the neck.  There is some stenosis of the left A2 ACA at the level of the genu of corpus callosum.  He no evidence of recurrent AVM.  2 small possible right ICA aneurysms versus.  Plain film of the right hip shows no acute fracture or dislocation.  Plain film of the L-spine shows no acute fracture or dislocation.  Official radiology report(s): CT ANGIO HEAD NECK W WO CM  Result Date: 11/22/2021 CLINICAL DATA:  CVA EXAM: CT ANGIOGRAPHY HEAD AND NECK TECHNIQUE: Multidetector CT imaging of the head and neck was performed using the standard protocol during bolus administration of intravenous contrast. Multiplanar CT image reconstructions and MIPs were obtained to evaluate the vascular anatomy. Carotid stenosis measurements (when applicable) are obtained utilizing NASCET criteria, using the distal internal carotid diameter as the denominator. CONTRAST:  75mL OMNIPAQUE IOHEXOL 350 MG/ML SOLN COMPARISON:  February 2018 disease FINDINGS: CTA NECK Aortic arch: Great vessel origins are patent. Right carotid system: Patent. Primarily calcified plaque at the  bifurcation and proximal internal carotid without stenosis. Left carotid system: Patent. Primarily calcified plaque at the bifurcation and proximal internal carotid without stenosis. Vertebral arteries: Patent.  Codominant.  No stenosis. Skeleton: Cervical spine degenerative changes. Other neck: Unremarkable. Upper chest: No apical lung mass. Review of the MIP images confirms the above findings CTA HEAD Anterior circulation: Intracranial internal carotid arteries are patent with mild calcified plaque. There are two small (1-2 mm) inferiorly directed outpouchings from the distal supraclinoid right ICA (series 11, image 25). The more proximal may be contiguous with a small vessel. Anterior cerebral arteries are patent. Anterior communicating artery is present. New focal marked stenosis of the azygous left A2 ACA at the level of the genu of the corpus callosum (series 11, image 26). Middle cerebral arteries are patent. Posterior circulation: Intracranial vertebral arteries are patent with mild calcified plaque. Basilar artery is patent. Major cerebellar artery origins are patent. No evidence of recurrent AVM. Venous sinuses: Patent as allowed by contrast bolus timing. Review of the MIP images confirms the above findings IMPRESSION: No large vessel occlusion.  No significant stenosis in the neck. 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 No evidence of recurrent AVM. Two small inferiorly directed outpouchings from the distal supraclinoid right ICA reflecting infundibula or aneurysms. Electronically Signed   By: Guadlupe Spanish M.D.   On: 11/22/2021 12:50   DG Lumbar Spine Complete  Result Date: 11/22/2021 CLINICAL DATA:  Right leg weakness. EXAM: LUMBAR SPINE - COMPLETE 4+ VIEW COMPARISON:  CT 04/11/2021 FINDINGS: Alignment of the lumbar spine is within normal limits. The vertebral body heights and disc spaces are maintained. Multilevel degenerative  endplate changes. Negative for fracture. Negative for a pars defect. IMPRESSION: No acute abnormality in lumbar spine. Electronically Signed   By: Richarda Overlie M.D.   On: 11/22/2021 11:52   CT HEAD WO CONTRAST ( )  Result Date: 11/22/2021 CLINICAL DATA:  Weakness, rule out stroke EXAM: CT HEAD WITHOUT CONTRAST TECHNIQUE: Contiguous axial images were  obtained from the base of the skull through the vertex without intravenous contrast. COMPARISON:  02/09/2017 FINDINGS: Brain: No definite evidence of acute infarction, hemorrhage, hydrocephalus, extra-axial collection or mass lesion/mass effect. Unchanged encephalomalacia of the right parietal vertex (series 2, image 26). There is new, subtle hypodensity of the deep white matter in the left parietal lobe (series 2, image 22). Vascular: No hyperdense vessel or unexpected calcification. Skull: Normal. Negative for fracture or focal lesion. Sinuses/Orbits: No acute finding. Other: None. IMPRESSION: 1. There is new, subtle hypodensity of the deep white matter in the left parietal lobe, consistent with infarction although age indeterminate, most likely subacute to chronic. Consider MRI to more sensitively evaluate for acute diffusion restricting infarction if suspected. 2. Unchanged nonacute encephalomalacia of the right parietal vertex. Electronically Signed   By: Jearld Lesch M.D.   On: 11/22/2021 11:24   DG Hip Unilat W or Wo Pelvis 2-3 Views Right  Result Date: 11/22/2021 CLINICAL DATA:  Right leg weakness. EXAM: DG HIP (WITH OR WITHOUT PELVIS) 2-3V RIGHT COMPARISON:  None. FINDINGS: Pelvic bony ring is intact. Symmetric appearance of the SI joints. Normal appearance of left hip. Right hip is located without a fracture. No significant joint space narrowing in the hips. IMPRESSION: No acute abnormality. Electronically Signed   By: Richarda Overlie M.D.   On: 11/22/2021 11:50    ____________________________________________   PROCEDURES  Procedure(s) performed  (including Critical Care):  .1-3 Lead EKG Interpretation Performed by: Gilles Chiquito, MD Authorized by: Gilles Chiquito, MD     Interpretation: non-specific     ECG rate assessment: normal     Rhythm: sinus rhythm     Ectopy: PAC     Conduction: normal     ____________________________________________   INITIAL IMPRESSION / ASSESSMENT AND PLAN / ED COURSE        Patient presents with above-stated history exam for assessment of some weakness in the right hip that started couple days ago as described above.  On arrival she is hypertensive.  She does seem weak in the right hip.  No significant associated pain there is no evidence of cellulitis or trauma.  Concern for possible CVA versus neuropathy.  CT head shows new hypodensity in the deep white matter in the left parietal lobe consistent with subacute infarct.  There is unchanged chronic encephalomalacia of the right parietal vertex.  CTA head and neck shows no large vessel occlusion or significant stenosis in the neck.  There is some stenosis of the left A2 ACA at the level of the genu of corpus callosum.  He no evidence of recurrent AVM.  2 small possible right ICA aneurysms versus.  ECG shows sinus rhythm with PAC and ventricular rate of 72 with some nonspecific changes and artifacts throughout otherwise unremarkable.  Plain film of the right hip shows no acute fracture or dislocation.  Plain film of the L-spine shows no acute fracture or dislocation.  BMP shows no significant electrolyte or metabolic derangements.  CBC shows no leukocytosis or acute anemia.  INR and PTT are unremarkable.  Serum ethanol undetectable.  Hepatic function panel unremarkable.  Lipid panel unremarkable.  Concerns except for CVA ordered MRI admit to medicine service for evaluation and management.     ____________________________________________   FINAL CLINICAL IMPRESSION(S) / ED DIAGNOSES  Final diagnoses:  Weakness  Cerebrovascular  accident (CVA), unspecified mechanism (HCC)    Medications  diazepam (VALIUM) tablet 2 mg (has no administration in time range)  iohexol (OMNIPAQUE) 350  MG/ML injection 75 mL (75 mLs Intravenous Contrast Given 11/22/21 1220)     ED Discharge Orders     None        Note:  This document was prepared using Dragon voice recognition software and may include unintentional dictation errors.    Gilles Chiquito, MD 11/22/21 1329

## 2021-11-22 NOTE — H&P (Signed)
[facility] History and Physical  Kristin Burgess BTD:[id number] DOB: 11-03-1961 DOA: 11/22/2021  Referring physician: Dr. Katrinka Blazing PCP: Serita Grit, MD   Chief Complaint: leg weakness  HPI: Kristin Burgess is a 60 y.o. female with history of hypertension, tobacco use, seizures, AVM, major depression, morbid obesity, who presents with leg weakness.  Patient reports that she has felt weakness and heaviness in her right leg since Thanksgiving day (8 days prior).  Then this morning she fell as she was getting out of bed which is what prompted her to present to the ED.  Prior to this she has had pain in bilateral legs and swelling but the weakness has been new.  She does not describe the sensation of her right leg as numbness or tingling, merely a heaviness like it is difficult to move.  Denies any other weakness, numbness, tingling anywhere else in her body.  Denies any chest pain, palpitations.  She does endorse smoking a pack of cigarettes about every 3 days.  Her father passed away from a stroke.  In the ED initial vital signs notable only for mild hypertension.  Lab work-up showed unremarkable CMP and BMP.  Unremarkable cholesterol studies.  EKG was sinus, no acute ischemic changes, overall appeared similar to prior.  Noncon CT head was obtained which showed a new hypodensity in the left parietal lobe, likely subacute to chronic in age.  She also underwent a plain film study of the lumbar spine and hips which were negative for any acute findings.  She also had a CTA head and neck which showed no large vessel occlusion or significant stenosis in the neck but there is new marked stenosis of the left A2 ACA.  She was then admitted for further management, concern for stroke.  Review of Systems:  Pertinent positives and negative per HPI, all others reviewed and negative  Past Medical History:  Diagnosis Date   AVM (arteriovenous malformation) brain    Brain bleed (HCC)    Depression     Hyperlipidemia    Hypertension    Past Surgical History:  Procedure Laterality Date   BRAIN SURGERY  2015   TUBAL LIGATION     Social History:  reports that she has been smoking cigarettes. She has been smoking an average of .25 packs per day. She has never used smokeless tobacco. She reports that she does not drink alcohol and does not use drugs.  Allergies  Allergen Reactions   Latex Itching    Family History  Problem Relation Age of Onset   Lung cancer Maternal Grandmother    Colon cancer Maternal Grandfather    Kidney cancer Neg Hx    Bladder Cancer Neg Hx      Prior to Admission medications   Medication Sig Start Date End Date Taking? Authorizing Provider  amLODipine (NORVASC) 5 MG tablet Take by mouth. 04/02/17   [provider]  citalopram (CELEXA) 10 MG tablet Take by mouth. 03/29/17 03/29/18  [provider]  HYDROcodone-acetaminophen (NORCO) 5-325 MG tablet Take 1 tablet 2 (two) times daily as needed by mouth. 11/08/17   Menshew, Charlesetta Ivory, PA-C  nabumetone (RELAFEN) 750 MG tablet Take 1 tablet (750 mg total) 2 (two) times daily by mouth. 11/08/17   Menshew, Charlesetta Ivory, PA-C  predniSONE (STERAPRED UNI-PAK 21 TAB) 10 MG (21) TBPK tablet As directed 04/11/21   Merwyn Katos, MD  solifenacin (VESICARE) 5 MG tablet Take 1 tablet (5 mg total) by mouth daily. 07/12/17  Alfredo Martinez, MD   Physical Exam: Vitals:   11/22/21 0924  BP: (!) 151/91  Pulse: 73  Resp: 18  Temp: 98.2 F (36.8 C)  TempSrc: Oral  SpO2: 96%  Weight: 119.7 kg  Height: 5\' 5"  (1.651 m)    Wt Readings from Last 3 Encounters:  11/22/21 119.7 kg  04/10/21 117.9 kg  11/08/17 121.1 kg     General:  Appears calm and comfortable Eyes: PERRL, normal lids, irises & conjunctiva ENT: grossly normal hearing, lips & tongue Neck: no masses Cardiovascular: RRR, no m/r/g. No LE edema. Telemetry: SR, no arrhythmias  Respiratory: CTA bilaterally, no w/r/r. Normal  respiratory effort. Abdomen: soft, ntnd Skin: no rash or induration seen on limited exam Musculoskeletal: grossly normal tone BUE/BLE Psychiatric: grossly normal mood and affect, speech fluent and appropriate Neurologic: grossly non-focal.  Strength 5 out of 5 and sensation intact to light touch in bilateral upper and lower extremities.  No clonus appreciated bilaterally.  Cranial nerves intact.          Labs on Admission:  Basic Metabolic Panel: Recent Labs  Lab 11/22/21 0927  NA 139  K 3.8  CL 107  CO2 26  GLUCOSE 107*  BUN 13  CREATININE 0.71  CALCIUM 8.7*   Liver Function Tests: Recent Labs  Lab 11/22/21 1205  AST 17  ALT 15  ALKPHOS 68  BILITOT 0.4  PROT 7.2  ALBUMIN 3.7   No results for input(s): LIPASE, AMYLASE in the last 168 hours. No results for input(s): AMMONIA in the last 168 hours. CBC: Recent Labs  Lab 11/22/21 0927  WBC 5.1  HGB 12.9  HCT 42.4  MCV 70.0*  PLT 241   Cardiac Enzymes: No results for input(s): CKTOTAL, CKMB, CKMBINDEX, TROPONINI in the last 168 hours.  BNP (last 3 results) No results for input(s): BNP in the last 8760 hours.  ProBNP (last 3 results) No results for input(s): PROBNP in the last 8760 hours.  CBG: No results for input(s): GLUCAP in the last 168 hours.  Radiological Exams on Admission: CT ANGIO HEAD NECK W WO CM  Result Date: 11/22/2021 CLINICAL DATA:  CVA EXAM: CT ANGIOGRAPHY HEAD AND NECK TECHNIQUE: Multidetector CT imaging of the head and neck was performed using the standard protocol during bolus administration of intravenous contrast. Multiplanar CT image reconstructions and MIPs were obtained to evaluate the vascular anatomy. Carotid stenosis measurements (when applicable) are obtained utilizing NASCET criteria, using the distal internal carotid diameter as the denominator. CONTRAST:  7mL OMNIPAQUE IOHEXOL 350 MG/ML SOLN COMPARISON:  February 2018 disease FINDINGS: CTA NECK Aortic arch: Great vessel origins  are patent. Right carotid system: Patent. Primarily calcified plaque at the bifurcation and proximal internal carotid without stenosis. Left carotid system: Patent. Primarily calcified plaque at the bifurcation and proximal internal carotid without stenosis. Vertebral arteries: Patent.  Codominant.  No stenosis. Skeleton: Cervical spine degenerative changes. Other neck: Unremarkable. Upper chest: No apical lung mass. Review of the MIP images confirms the above findings CTA HEAD Anterior circulation: Intracranial internal carotid arteries are patent with mild calcified plaque. There are two small (1-2 mm) inferiorly directed outpouchings from the distal supraclinoid right ICA (series 11, image 25). The more proximal may be contiguous with a small vessel. Anterior cerebral arteries are patent. Anterior communicating artery is present. New focal marked stenosis of the azygous left A2 ACA at the level of the genu of the corpus callosum (series 11, image 26). Middle cerebral arteries are patent. Posterior circulation: Intracranial  vertebral arteries are patent with mild calcified plaque. Basilar artery is patent. Major cerebellar artery origins are patent. No evidence of recurrent AVM. Venous sinuses: Patent as allowed by contrast bolus timing. Review of the MIP images confirms the above findings IMPRESSION: No large vessel occlusion.  No significant stenosis in the neck. 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 No evidence of recurrent AVM. Two small inferiorly directed outpouchings from the distal supraclinoid right ICA reflecting infundibula or aneurysms. Electronically Signed   By: Guadlupe Spanish M.D.   On: 11/22/2021 12:50   DG Lumbar Spine Complete  Result Date: 11/22/2021 CLINICAL DATA:  Right leg weakness. EXAM: LUMBAR SPINE - COMPLETE 4+ VIEW COMPARISON:  CT 04/11/2021 FINDINGS: Alignment of the lumbar spine is within normal limits. The  vertebral body heights and disc spaces are maintained. Multilevel degenerative endplate changes. Negative for fracture. Negative for a pars defect. IMPRESSION: No acute abnormality in lumbar spine. Electronically Signed   By: Richarda Overlie M.D.   On: 11/22/2021 11:52   CT HEAD WO CONTRAST ( )  Result Date: 11/22/2021 CLINICAL DATA:  Weakness, rule out stroke EXAM: CT HEAD WITHOUT CONTRAST TECHNIQUE: Contiguous axial images were obtained from the base of the skull through the vertex without intravenous contrast. COMPARISON:  02/09/2017 FINDINGS: Brain: No definite evidence of acute infarction, hemorrhage, hydrocephalus, extra-axial collection or mass lesion/mass effect. Unchanged encephalomalacia of the right parietal vertex (series 2, image 26). There is new, subtle hypodensity of the deep white matter in the left parietal lobe (series 2, image 22). Vascular: No hyperdense vessel or unexpected calcification. Skull: Normal. Negative for fracture or focal lesion. Sinuses/Orbits: No acute finding. Other: None. IMPRESSION: 1. There is new, subtle hypodensity of the deep white matter in the left parietal lobe, consistent with infarction although age indeterminate, most likely subacute to chronic. Consider MRI to more sensitively evaluate for acute diffusion restricting infarction if suspected. 2. Unchanged nonacute encephalomalacia of the right parietal vertex. Electronically Signed   By: Jearld Lesch M.D.   On: 11/22/2021 11:24   DG Hip Unilat W or Wo Pelvis 2-3 Views Right  Result Date: 11/22/2021 CLINICAL DATA:  Right leg weakness. EXAM: DG HIP (WITH OR WITHOUT PELVIS) 2-3V RIGHT COMPARISON:  None. FINDINGS: Pelvic bony ring is intact. Symmetric appearance of the SI joints. Normal appearance of left hip. Right hip is located without a fracture. No significant joint space narrowing in the hips. IMPRESSION: No acute abnormality. Electronically Signed   By: Richarda Overlie M.D.   On: 11/22/2021 11:50    EKG:  Independently reviewed.  Sinus, no acute ischemic changes, overall similar appearance to prior.  Assessment/Plan Principal Problem:   Leg weakness Active Problems:   Essential hypertension   H/O arteriovenous malformation (AVM)   History of seizures   Major depressive disorder, recurrent episode with mixed features (HCC)   Morbid obesity (HCC)   Tobacco abuse   Kristin Burgess is a 60 y.o. female with history of hypertension, tobacco use, seizures, right-sided brain AVM status post repair in 2014, major depression, morbid obesity, who presents with leg weakness and has CT imaging findings concerning for CVA with work-up ongoing.  #Right leg weakness #Abnormal CT head #Mechanical fall New areas of hypodensity concerning for subacute to chronic ischemia in the left parietal lobe along with new stenosis seen on CTA head.  Neurological exam is actually unremarkable.  Will obtain MRI to further evaluate. -  Follow-up MRI head - Consult neurology pending results - PT/OT/SLP consultations - Hold off on TTE pending MRI results - passed bedside swallow test, heart healthy diet ordered - Telemetry  #Chronic medical problems Hypertension-continue amlodipine, hydrochlorothiazide, propranolol  Hyperlipidemia-continue atorvastatin  Neuropathy-continue with duloxetine, gabapentin  Seizure disorder-continue lamotrigine  OAB-continue Vesicare  Code Status: Full code, confirmed DVT Prophylaxis: Lovenox Family Communication: Son updated at bedside Disposition Plan: Inpatient, MedSurg  Time spent: 82 min  Venora Maples MD/MPH [facility]  Note:  This document was prepared using Conservation officer, historic buildings and may include unintentional dictation errors.

## 2021-11-23 ENCOUNTER — Inpatient Hospital Stay (HOSPITAL_COMMUNITY)
Admit: 2021-11-23 | Discharge: 2021-11-23 | Disposition: A | Discharge status: Home / Self Care | Payer: Medicare Other | Attending: Family Medicine | Admitting: Family Medicine

## 2021-11-23 DIAGNOSIS — I6389 Other cerebral infarction: Secondary | ICD-10-CM | POA: Diagnosis not present

## 2021-11-23 DIAGNOSIS — R29898 Other symptoms and signs involving the musculoskeletal system: Secondary | ICD-10-CM

## 2021-11-23 DIAGNOSIS — I10 Essential (primary) hypertension: Secondary | ICD-10-CM

## 2021-11-23 DIAGNOSIS — I639 Cerebral infarction, unspecified: Principal | ICD-10-CM

## 2021-11-23 LAB — BASIC METABOLIC PANEL
Anion gap: 5 (ref 5–15)
BUN: 9 mg/dL (ref 6–20)
CO2: 23 mmol/L (ref 22–32)
Calcium: 8.8 mg/dL — ABNORMAL LOW (ref 8.9–10.3)
Chloride: 110 mmol/L (ref 98–111)
Creatinine, Ser: 0.67 mg/dL (ref 0.44–1.00)
GFR, Estimated: >60 mL/min (ref >60)
Glucose, Bld: 107 mg/dL — ABNORMAL HIGH (ref 70–99)
Potassium: 3.8 mmol/L (ref 3.5–5.1)
Sodium: 138 mmol/L (ref 135–145)

## 2021-11-23 LAB — CBC
HCT: 41.5 % (ref 36.0–46.0)
Hemoglobin: 12.6 g/dL (ref 12.0–15.0)
MCH: 21 pg — ABNORMAL LOW (ref 26.0–34.0)
MCHC: 30.4 g/dL (ref 30.0–36.0)
MCV: 69.3 fL — ABNORMAL LOW (ref 80.0–100.0)
Platelets: 223 10*3/uL (ref 150–400)
RBC: 5.99 MIL/uL — ABNORMAL HIGH (ref 3.87–5.11)
RDW: 16.6 % — ABNORMAL HIGH (ref 11.5–15.5)
WBC: 3.9 10*3/uL — ABNORMAL LOW (ref 4.0–10.5)
nRBC: 0 % (ref 0.0–0.2)

## 2021-11-23 LAB — ECHOCARDIOGRAM COMPLETE
AR max vel: 1.82 cm2
AV Peak grad: 6.1 mmHg
Ao pk vel: 1.23 m/s
Area-P 1/2: 2.86 cm2
Height: 65 in
S' Lateral: 3 cm
Weight: 4224 oz

## 2021-11-23 MED ORDER — CLOPIDOGREL BISULFATE 75 MG PO TABS
300.0000 mg | ORAL_TABLET | Freq: Once | ORAL | Status: AC
  Administered 2021-11-23: 15:00:00 300 mg via ORAL
  Filled 2021-11-23: qty 4

## 2021-11-23 MED ORDER — ASPIRIN EC 81 MG PO TBEC
81.0000 mg | DELAYED_RELEASE_TABLET | Freq: Every day | ORAL | Status: DC
  Administered 2021-11-23 – 2021-11-26 (×4): 81 mg via ORAL
  Filled 2021-11-23 (×4): qty 1

## 2021-11-23 MED ORDER — ATORVASTATIN CALCIUM 20 MG PO TABS
80.0000 mg | ORAL_TABLET | Freq: Every day | ORAL | Status: DC
  Administered 2021-11-24 – 2021-11-26 (×3): 80 mg via ORAL
  Filled 2021-11-23 (×3): qty 4

## 2021-11-23 MED ORDER — CLOPIDOGREL BISULFATE 75 MG PO TABS
75.0000 mg | ORAL_TABLET | Freq: Every day | ORAL | Status: DC
  Administered 2021-11-24 – 2021-11-26 (×3): 75 mg via ORAL
  Filled 2021-11-23 (×3): qty 1

## 2021-11-23 MED ORDER — HYDRALAZINE HCL 20 MG/ML IJ SOLN
20.0000 mg | Freq: Three times a day (TID) | INTRAMUSCULAR | Status: DC | PRN
  Administered 2021-11-23: 15:00:00 20 mg via INTRAVENOUS
  Filled 2021-11-23: qty 1

## 2021-11-23 NOTE — Progress Notes (Signed)
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 Neuro consulted. Continue on statin. PT/OT consulted   HTN: continue amlodipine, propranolol & HCTZ  HLD: continue on statin   Neuropathy: continue on duloxetine, gabapentin  Seizure disorder: continue lamotrigine  Morbid obesity: BMI 43.9. Complicates overall care & prognosis    DVT prophylaxis: lovenox  Code Status: full  Family Communication:  Disposition Plan: depends on PT/OT recs   Level of care: Med-Surg  Status is: Inpatient  Remains inpatient appropriate because: severity of illness, acute CVA     Consultants:  Neuro   Procedures:   Antimicrobials:     Subjective: Pt c/o leg weakness   Objective: Vitals:   11/23/21 0800 11/23/21 0815 11/23/21 0900 11/23/21 1000  BP:      Pulse:      Resp: 19 19 16 18   Temp:      TempSrc:      SpO2:      Weight:      Height:        Intake/Output Summary (Last 24 hours) at 11/23/2021 1054 Last data filed at 11/23/2021 0900 Gross per 24 hour  Intake 240 ml  Output --  Net 240 ml   Filed Weights   11/22/21 0924  Weight: 119.7 kg    Examination:  General exam: Appears calm and comfortable  Respiratory system: Clear to auscultation. Respiratory effort normal. Cardiovascular system: S1 & S2+. No rubs, gallops or clicks.  Gastrointestinal system: Abdomen is obese, soft and nontender.  Normal bowel sounds heard. Central nervous system: Alert and oriented. Moves all extremities  Psychiatry: Judgement and insight appear normal. Flat mood and  affect    Data Reviewed: I have personally reviewed following labs and imaging studies  CBC: Recent Labs  Lab 11/22/21 0927 11/23/21 0803  WBC 5.1 3.9*  HGB 12.9 12.6  HCT 42.4 41.5  MCV 70.0* 69.3*  PLT 241 223   Basic Metabolic Panel: Recent Labs  Lab 11/22/21 0927 11/23/21 0803  NA 139 138  K 3.8 3.8  CL 107 110  CO2 26 23  GLUCOSE 107* 107*  BUN 13 9  CREATININE 0.71 0.67  CALCIUM 8.7* 8.8*   GFR: Estimated Creatinine Clearance: 98.1 mL/min (by C-G formula based on SCr of 0.67 mg/dL). Liver Function Tests: Recent Labs  Lab 11/22/21 1205  AST 17  ALT 15  ALKPHOS 68  BILITOT 0.4  PROT 7.2  ALBUMIN 3.7   No results for input(s): LIPASE, AMYLASE in the last 168 hours. No results for input(s): AMMONIA in the last 168 hours. Coagulation Profile: Recent Labs  Lab 11/22/21 1205  INR 1.1   Cardiac Enzymes: No results for input(s): CKTOTAL, CKMB, CKMBINDEX, TROPONINI in the last 168 hours. BNP (last 3 results) No results for input(s): PROBNP in the last 8760 hours. HbA1C: No results for input(s): HGBA1C in the last 72 hours. CBG: No results for input(s): GLUCAP in the last 168 hours. Lipid Profile: Recent Labs    11/22/21 1205  CHOL 152  HDL 57  LDLCALC 87  TRIG 39  CHOLHDL 2.7  Thyroid Function Tests: No results for input(s): TSH, T4TOTAL, FREET4, T3FREE, THYROIDAB in the last 72 hours. Anemia Panel: No results for input(s): VITAMINB12, FOLATE, FERRITIN, TIBC, IRON, RETICCTPCT in the last 72 hours. Sepsis Labs: No results for input(s): PROCALCITON, LATICACIDVEN in the last 168 hours.  Recent Results (from the past 240 hour(s))  Resp Panel by RT-PCR (Flu A&B, Covid) Nasopharyngeal Swab     Status: None   Collection Time: 11/22/21 12:05 PM   Specimen: Nasopharyngeal Swab; Nasopharyngeal(NP) swabs in vial transport medium  Result Value Ref Range Status   SARS Coronavirus 2 by RT PCR NEGATIVE NEGATIVE Final    Comment: (NOTE) SARS-CoV-2  target nucleic acids are NOT DETECTED.  The SARS-CoV-2 RNA is generally detectable in upper respiratory specimens during the acute phase of infection. The lowest concentration of SARS-CoV-2 viral copies this assay can detect is 138 copies/mL. A negative result does not preclude SARS-Cov-2 infection and should not be used as the sole basis for treatment or other patient management decisions. A negative result may occur with  improper specimen collection/handling, submission of specimen other than nasopharyngeal swab, presence of viral mutation(s) within the areas targeted by this assay, and inadequate number of viral copies(<138 copies/mL). A negative result must be combined with clinical observations, patient history, and epidemiological information. The expected result is Negative.  Fact Sheet for Patients:  BloggerCourse.com  Fact Sheet for Healthcare Providers:  SeriousBroker.it  This test is no t yet approved or cleared by the [place] FDA and  has been authorized for detection and/or diagnosis of SARS-CoV-2 by FDA under an Emergency Use Authorization (EUA). This EUA will remain  in effect (meaning this test can be used) for the duration of the COVID-19 declaration under Section 564(b)(1) of the Act, 21 U.S.C.section 360bbb-3(b)(1), unless the authorization is terminated  or revoked sooner.       Influenza A by PCR NEGATIVE NEGATIVE Final   Influenza B by PCR NEGATIVE NEGATIVE Final    Comment: (NOTE) The Xpert Xpress SARS-CoV-2/FLU/RSV plus assay is intended as an aid in the diagnosis of influenza from Nasopharyngeal swab specimens and should not be used as a sole basis for treatment. Nasal washings and aspirates are unacceptable for Xpert Xpress SARS-CoV-2/FLU/RSV testing.  Fact Sheet for Patients: BloggerCourse.com  Fact Sheet for Healthcare  Providers: SeriousBroker.it  This test is not yet approved or cleared by the [place] FDA and has been authorized for detection and/or diagnosis of SARS-CoV-2 by FDA under an Emergency Use Authorization (EUA). This EUA will remain in effect (meaning this test can be used) for the duration of the COVID-19 declaration under Section 564(b)(1) of the Act, 21 U.S.C. section 360bbb-3(b)(1), unless the authorization is terminated or revoked.  Performed at [facility], [address] [place], [place] [address]          Radiology Studies: CT ANGIO HEAD NECK W WO CM  Result Date: 11/22/2021 CLINICAL DATA:  CVA EXAM: CT ANGIOGRAPHY HEAD AND NECK TECHNIQUE: Multidetector CT imaging of the head and neck was performed using the standard protocol during bolus administration of intravenous contrast. Multiplanar CT image reconstructions and MIPs were obtained to evaluate the vascular anatomy. Carotid stenosis measurements (when applicable) are obtained utilizing NASCET criteria, using the distal internal carotid diameter as the denominator. CONTRAST:  35mL OMNIPAQUE IOHEXOL 350 MG/ML SOLN COMPARISON:  February 2018 disease FINDINGS: CTA NECK Aortic arch: Great vessel origins are patent. Right carotid system: Patent. Primarily calcified plaque at the bifurcation and proximal internal  carotid without stenosis. Left carotid system: Patent. Primarily calcified plaque at the bifurcation and proximal internal carotid without stenosis. Vertebral arteries: Patent.  Codominant.  No stenosis. Skeleton: Cervical spine degenerative changes. Other neck: Unremarkable. Upper chest: No apical lung mass. Review of the MIP images confirms the above findings CTA HEAD Anterior circulation: Intracranial internal carotid arteries are patent with mild calcified plaque. There are two small (1-2 mm) inferiorly directed outpouchings from the distal supraclinoid right ICA (series 11, image 25).  The more proximal may be contiguous with a small vessel. Anterior cerebral arteries are patent. Anterior communicating artery is present. New focal marked stenosis of the azygous left A2 ACA at the level of the genu of the corpus callosum (series 11, image 26). Middle cerebral arteries are patent. Posterior circulation: Intracranial vertebral arteries are patent with mild calcified plaque. Basilar artery is patent. Major cerebellar artery origins are patent. No evidence of recurrent AVM. Venous sinuses: Patent as allowed by contrast bolus timing. Review of the MIP images confirms the above findings IMPRESSION: No large vessel occlusion.  No significant stenosis in the neck. 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 No evidence of recurrent AVM. Two small inferiorly directed outpouchings from the distal supraclinoid right ICA reflecting infundibula or aneurysms. Electronically Signed   By: Guadlupe Spanish M.D.   On: 11/22/2021 12:50   DG Lumbar Spine Complete  Result Date: 11/22/2021 CLINICAL DATA:  Right leg weakness. EXAM: LUMBAR SPINE - COMPLETE 4+ VIEW COMPARISON:  CT 04/11/2021 FINDINGS: Alignment of the lumbar spine is within normal limits. The vertebral body heights and disc spaces are maintained. Multilevel degenerative endplate changes. Negative for fracture. Negative for a pars defect. IMPRESSION: No acute abnormality in lumbar spine. Electronically Signed   By: Richarda Overlie M.D.   On: 11/22/2021 11:52   CT HEAD WO CONTRAST ( )  Result Date: 11/22/2021 CLINICAL DATA:  Weakness, rule out stroke EXAM: CT HEAD WITHOUT CONTRAST TECHNIQUE: Contiguous axial images were obtained from the base of the skull through the vertex without intravenous contrast. COMPARISON:  02/09/2017 FINDINGS: Brain: No definite evidence of acute infarction, hemorrhage, hydrocephalus, extra-axial collection or mass lesion/mass effect. Unchanged encephalomalacia of  the right parietal vertex (series 2, image 26). There is new, subtle hypodensity of the deep white matter in the left parietal lobe (series 2, image 22). Vascular: No hyperdense vessel or unexpected calcification. Skull: Normal. Negative for fracture or focal lesion. Sinuses/Orbits: No acute finding. Other: None. IMPRESSION: 1. There is new, subtle hypodensity of the deep white matter in the left parietal lobe, consistent with infarction although age indeterminate, most likely subacute to chronic. Consider MRI to more sensitively evaluate for acute diffusion restricting infarction if suspected. 2. Unchanged nonacute encephalomalacia of the right parietal vertex. Electronically Signed   By: Jearld Lesch M.D.   On: 11/22/2021 11:24   MR BRAIN WO CONTRAST  Result Date: 11/22/2021 CLINICAL DATA:  Neuro deficit, acute, stroke suspected EXAM: MRI HEAD WITHOUT CONTRAST TECHNIQUE: Multiplanar, multiecho pulse sequences of the brain and surrounding structures were obtained without intravenous contrast. COMPARISON:  None. FINDINGS: Motion artifact is present. Brain: Few scattered small foci of restricted diffusion are present in the parasagittal left frontoparietal lobes. Small focus also present within the right frontal lobe. Chronic infarct of the right parietal lobe with chronic blood products. Additional patchy T2 hyperintensity in the supratentorial white matter is nonspecific but may reflect chronic microvascular ischemic changes.  Ventricles and sulci are within normal limits in size and configuration. Vascular: Major vessel flow voids at the skull base are preserved. Skull and upper cervical spine: Normal marrow signal is preserved. Sinuses/Orbits: Paranasal sinuses are aerated. Orbits are unremarkable. Other: Sella is unremarkable.  Mastoid air cells are clear. IMPRESSION: Scattered small acute infarcts in the parasagittal left frontoparietal lobes much greater than right frontal lobe probably representing ACA  and ACA/MCA watershed infarcts in the setting of azygous A2 ACA stenosis seen on CTA. Chronic right parietal infarct. Chronic microvascular ischemic changes. Electronically Signed   By: Guadlupe Spanish M.D.   On: 11/22/2021 15:51   DG Hip Unilat W or Wo Pelvis 2-3 Views Right  Result Date: 11/22/2021 CLINICAL DATA:  Right leg weakness. EXAM: DG HIP (WITH OR WITHOUT PELVIS) 2-3V RIGHT COMPARISON:  None. FINDINGS: Pelvic bony ring is intact. Symmetric appearance of the SI joints. Normal appearance of left hip. Right hip is located without a fracture. No significant joint space narrowing in the hips. IMPRESSION: No acute abnormality. Electronically Signed   By: Richarda Overlie M.D.   On: 11/22/2021 11:50        Scheduled Meds:  amLODipine  10 mg Oral Daily   atorvastatin  40 mg Oral Daily   darifenacin  15 mg Oral Daily   DULoxetine  30 mg Oral Daily   enoxaparin (LOVENOX) injection  40 mg Subcutaneous Q24H   gabapentin  300 mg Oral QPM   hydrochlorothiazide  12.5 mg Oral Daily   lamoTRIgine  100 mg Oral QHS   propranolol  10 mg Oral BID   traZODone  50 mg Oral QHS   Continuous Infusions:   LOS: 1 day    Time spent: 33 mins    Charise Killian, MD [facility] Pager [phone]  If 7PM-7AM, please contact night-coverage 11/23/2021, 10:54 AM

## 2021-11-23 NOTE — Evaluation (Signed)
Physical Therapy Evaluation Patient Details Name: Kristin Burgess MRN: [id number] DOB: March 12, 1961 Today's Date: 11/23/2021  History of Present Illness  60 y.o. female with history of hypertension, tobacco use, seizures, AVM, major depression, morbid obesity, who presents with right leg weakness. MRI idicates scattered small acute infarcts in the left  frontoparietal lobes much greater than right frontal lobe and chronic right parietal infarct.  Clinical Impression  Pt received supine in bed, agreeable to therapy and eager to get out of bed and to the recliner. Pt lives alone in a single story home, has 5 STE with a single railing that is unsafe to use (PT rec pt contact landlord to fix railing). At current baseline, pt receives assist from family and friends with some ADLs and IADLs; she reports to be independent with household distance ambulation using Rollator and basic transfers.     Co-eval performed for pt and therapist safety. PT asking OT to join after realizing STS with assist of 1 would not be safe.  Upon evaluation, pt demonstrates weakness in RLE with difficulty sustaining muscular contraction and LUE primarily GH joint and grip strength. Pt reports decreased sensation in LUE and hypersensitivity throughout RLE. Pt was able to perform bed mobility with MOD A. STS required MAX A x2 for lift, steadying, blocking RLE - MAX A x2 for lowering back to surface. +2 required for safety. Stand pivot attempted - not safe. Pt performed lateral scoot transfer from EOB>recliner with MIN A x2 to block RLE (maintain knee flexion) and guard/stabilize trunk to prevent posterior LOB as pt moves with a lot of momentum and decreased safety awareness. Pt is not at her baseline and would benefit from intensive therapy daily to maximize her rehab potential. Would benefit from skilled PT to address above deficits and promote optimal return to PLOF.    Recommendations for follow up therapy are one component of a  multi-disciplinary discharge planning process, led by the attending physician.  Recommendations may be updated based on patient status, additional functional criteria and insurance authorization.  Follow Up Recommendations Acute inpatient rehab (3hours/day)    Assistance Recommended at Discharge Frequent or constant Supervision/Assistance  Functional Status Assessment Patient has had a recent decline in their functional status and demonstrates the ability to make significant improvements in function in a reasonable and predictable amount of time.  Equipment Recommendations  Other (comment) (TBD at next venue of care)    Recommendations for Other Services       Precautions / Restrictions Precautions Precautions: Fall Restrictions Weight Bearing Restrictions: No      Mobility  Bed Mobility Overal bed mobility: Needs Assistance Bed Mobility: Supine to Sit     Supine to sit: Mod assist;HOB elevated     General bed mobility comments: to assist with RLE management and minor trunk lift. Increased time, bed features used.    Transfers Overall transfer level: Needs assistance Equipment used: Rolling walker (2 wheels) Transfers: Sit to/from Stand;Bed to chair/wheelchair/BSC Sit to Stand: Max assist;+2 physical assistance;+2 safety/equipment          Lateral/Scoot Transfers: Min assist;+2 safety/equipment General transfer comment: STS x3 reps from elevated EOB (x2) and recliner (x1). MAX A x2 for lift, steadying, blocking RLE - MAX A x2 for lowering back to surface. +2 required for safety. Stand pivot attempted - not safe. Pt performed lateral scoot transfer from EOB>recliner with MIN A x2 to block RLE (maintain knee flexion) and guard/stabilize trunk to prevent posterior LOB as pt moves with a lot  of momentum and decreased safety awareness.    Ambulation/Gait               General Gait Details: deferred for safety  Stairs            Wheelchair Mobility     Modified Rankin (Stroke Patients Only)       Balance Overall balance assessment: Needs assistance Sitting-balance support: Feet supported Sitting balance-Leahy Scale: Good Sitting balance - Comments: Good static sitting balance. Sitting balance during mobility (lateral transfer) declined to poor with posterior trunk lean due to momentum of scooting, poor safety awareness and limited command following. Postural control: Posterior lean Standing balance support: Bilateral upper extremity supported;During functional activity;Reliant on assistive device for balance Standing balance-Leahy Scale: Zero Standing balance comment: MAX A x2 to maintain static standing position utilizing RW for BUE support. Pt had difficulty positioning hands safely/correctly on RW.                             Pertinent Vitals/Pain Pain Assessment: Faces Faces Pain Scale: Hurts even more Pain Location: RLE and LUE (during mobility) Pain Descriptors / Indicators: Tingling;Sharp;Other (Comment) ("very sensitive") Pain Intervention(s): Limited activity within patient's tolerance;Monitored during session;Repositioned    Home Living Family/patient expects to be discharged to:: Private residence Living Arrangements: Alone Available Help at Discharge: Family;Available PRN/intermittently;Friend(s) Type of Home: House Home Access: Stairs to enter Entrance Stairs-Rails: Right (states it is wobbly and not safe to use) Entrance Stairs-Number of Steps: 5   Home Layout: One level Home Equipment: Rollator (4 wheels) Additional Comments: Does not have shower chair - sits on side of tub during shower due to decreased standing endurance. Uses Rollator (>1 year) due to decreased walking endurance.    Prior Function Prior Level of Function : Needs assist;History of Falls (last six months)       Physical Assist : Mobility (physical);ADLs (physical) Mobility (physical): Stairs ADLs (physical):  Bathing;Dressing;IADLs Mobility Comments: Reports Mod I with short distance ambulation and basic transfers. 1 fall reported over the past 6 months. ADLs Comments: Family and friends assist with getting into/out of the shower as well as process of showering, LB dressing, and IADLs - pt does attempt to assist with cooking.     Hand Dominance        Extremity/Trunk Assessment   Upper Extremity Assessment Upper Extremity Assessment: LUE deficits/detail LUE Deficits / Details: 3/5 GH flexion. 5/5 elbow flexion and extension. Diminshed grip strength (significantly compared to R). LUE: Shoulder pain with ROM;Shoulder pain at rest LUE Sensation: decreased light touch    Lower Extremity Assessment Lower Extremity Assessment: RLE deficits/detail RLE Deficits / Details: <3/5 hip flexion (unable to achieve full range), 3/5 knee extension and DF. Inability to sustain contraction for MMT. Pt reports hypersensitivity with LT and deep pressure testing. Decreased coordination with heel-to-shin test.       Communication   Communication: No difficulties  Cognition Arousal/Alertness: Awake/alert Behavior During Therapy: WFL for tasks assessed/performed;Anxious;Impulsive Overall Cognitive Status: Within Functional Limits for tasks assessed                                 General Comments: A&Ox4. Limited safety awareness. Pt reports understanding instructions however has difficulty with following direction; limited carryover. Pt reporting she "walked with 2 nurses last night" however nurse does not confirm, PT doubtful.  General Comments      Exercises Other Exercises Other Exercises: Pt reporting lightheadedness and dizziness upon sitting up in bed - supine and sitting orthostatics assessed, unable to tolerate standing. Supine>sit, assessment of LE and UE strength/sensation/coordination, STSx3 reps, lateral transfer. Pt education on d/c recs with multiple questions answered  regarding d/c options and rehab potential following CVA.   Assessment/Plan    PT Assessment Patient needs continued PT services  PT Problem List Decreased strength;Decreased mobility;Decreased safety awareness;Decreased coordination;Decreased activity tolerance;Decreased balance;Decreased knowledge of use of DME;Impaired sensation;Pain       PT Treatment Interventions DME instruction;Therapeutic activities;Gait training;Therapeutic exercise;Patient/family education;Stair training;Balance training;Functional mobility training;Neuromuscular re-education    PT Goals (Current goals can be found in the Care Plan section)  Acute Rehab PT Goals Patient Stated Goal: to go home PT Goal Formulation: With patient Time For Goal Achievement: 12/07/21 Potential to Achieve Goals: Good    Frequency 7X/week   Barriers to discharge Inaccessible home environment;Decreased caregiver support Pt lives alone and has 5 STE home. Currently unsafe to stand without MAX A x2.    Co-evaluation PT/OT/SLP Co-Evaluation/Treatment: Yes Reason for Co-Treatment: For patient/therapist safety;To address functional/ADL transfers PT goals addressed during session: Mobility/safety with mobility;Balance;Strengthening/ROM;Proper use of DME OT goals addressed during session: ADL's and self-care;Proper use of Adaptive equipment and DME;Strengthening/ROM       AM-PAC PT "6 Clicks" Mobility  Outcome Measure Help needed turning from your back to your side while in a flat bed without using bedrails?: A Little Help needed moving from lying on your back to sitting on the side of a flat bed without using bedrails?: A Little Help needed moving to and from a bed to a chair (including a wheelchair)?: A Lot Help needed standing up from a chair using your arms (e.g., wheelchair or bedside chair)?: A Lot Help needed to walk in hospital room?: Total Help needed climbing 3-5 steps with a railing? : Total 6 Click Score: 12    End of  Session Equipment Utilized During Treatment: Gait belt Activity Tolerance: Patient limited by pain;Patient tolerated treatment well;Patient limited by fatigue Patient left: in chair;with call bell/phone within reach;with chair alarm set Nurse Communication: Mobility status;Precautions PT Visit Diagnosis: Unsteadiness on feet (R26.81);Other abnormalities of gait and mobility (R26.89);Muscle weakness (generalized) (M62.81);History of falling (Z91.81);Difficulty in walking, not elsewhere classified (R26.2);Other symptoms and signs involving the nervous system (R29.898);Hemiplegia and hemiparesis Hemiplegia - Right/Left: Right (RLE and LUE) Hemiplegia - dominant/non-dominant: Dominant Hemiplegia - caused by: Cerebral infarction    Time: 0347-4259 PT Time Calculation (min) (ACUTE ONLY): 61 min   Charges:   PT Evaluation $PT Eval Moderate Complexity: 1 Mod PT Treatments $Therapeutic Activity: 23-37 mins        Basilia Jumbo PT, DPT 11/23/21 11:42 AM [phone]

## 2021-11-23 NOTE — Evaluation (Signed)
Occupational Therapy Evaluation Patient Details Name: Kristin Burgess MRN: [id number] DOB: 03/08/1961 Today's Date: 11/23/2021   History of Present Illness 60 y.o. female with history of hypertension, tobacco use, seizures, AVM, major depression, morbid obesity, who presents with right leg weakness. MRI indicates scattered small acute infarcts in the left frontoparietal lobes much greater than right frontal lobe and chronic right parietal infarct.   Clinical Impression   Pt seen for OT evaluation this date. Upon arrival to room, pt sitting EOB with PT present (PT requesting +2 for physical assist during functional mobility). Prior to admission, pt was living in a 1-level home alone, receiving physical assist from family/friends for shower transfers, LB dressing, and IADLs. At baseline, pt is MOD-I with RW for functional mobility of short household distances. Pt endorses 1 fall within the past 6 months. Pt currently presents with LUE weakness, RLE weakness/pain, and decreased balance. Due to these current functional impairments, pt requires SUPERVISION/SET-UP for seated grooming tasks, MAX A for seated LB dressing, MAX A+2 for sit>stand posterior peri-care, and MIN A+2 for lateral scoot bed>chair (with armrest lowered). Pt currently requires significantly more assist for functional transfers compared to baseline and would benefit from acute inpatient rehab (3 hours/day) upon discharge to maximize return to PLOF and minimize risk of future falls, injury, caregiver burden, and readmission.    Recommendations for follow up therapy are one component of a multi-disciplinary discharge planning process, led by the attending physician.  Recommendations may be updated based on patient status, additional functional criteria and insurance authorization.   Follow Up Recommendations  Acute inpatient rehab (3hours/day)    Assistance Recommended at Discharge Frequent or constant Supervision/Assistance  Functional  Status Assessment  Patient has had a recent decline in their functional status and demonstrates the ability to make significant improvements in function in a reasonable and predictable amount of time.  Equipment Recommendations  BSC/3in1;Tub/shower bench       Precautions / Restrictions Precautions Precautions: Fall Restrictions Weight Bearing Restrictions: No      Mobility Bed Mobility      General bed mobility comments: not assessed, pt sitting EOB with PT upon arrival    Transfers Overall transfer level: Needs assistance Equipment used: Rolling walker (2 wheels) Transfers: Sit to/from Stand;Bed to chair/wheelchair/BSC Sit to Stand: Max assist;+2 physical assistance;+2 safety/equipment          Lateral/Scoot Transfers: Min assist;+2 safety/equipment General transfer comment: STS x3 reps from elevated EOB (x2) and recliner (x1). MAX A x2 for lift, steadying, blocking RLE - MAX A x2 for lowering back to surface. +2 required for safety. Stand pivot attempted - not safe. Pt performed lateral scoot transfer from EOB>recliner with MIN A x2 to block RLE (maintain knee flexion) and guard/stabilize trunk to prevent posterior LOB as pt moves with a lot of momentum and decreased safety awareness.      Balance Overall balance assessment: Needs assistance Sitting-balance support: No upper extremity supported;Feet supported Sitting balance-Leahy Scale: Good Sitting balance - Comments: Good sitting balance reaching without BOS at EOB. Oberserves to have posterior trunk lean during lateral/scoot transfers Postural control: Posterior lean Standing balance support: Bilateral upper extremity supported;During functional activity;Reliant on assistive device for balance Standing balance-Leahy Scale: Zero Standing balance comment: MAX A x2 to maintain static standing position utilizing RW for BUE support. Pt had difficulty positioning hands safely/correctly on RW.  ADL either performed or assessed with clinical judgement   ADL Overall ADL's : Needs assistance/impaired     Grooming: Wash/dry face;Supervision/safety;Set up;Sitting               Lower Body Dressing: Maximal assistance;Sitting/lateral leans Lower Body Dressing Details (indicate cue type and reason): to don socks     Toileting- Clothing Manipulation and Hygiene: Maximal assistance;+2 for physical assistance;Sit to/from stand               Vision Baseline Vision/History: 1 Wears glasses Ability to See in Adequate Light: 0 Adequate              Pertinent Vitals/Pain Pain Assessment: Faces Faces Pain Scale: Hurts even more Pain Location: RLE and LUE (during mobility) Pain Descriptors / Indicators: Tingling;Sharp;Other (Comment) ("very sensitive") Pain Intervention(s): Limited activity within patient's tolerance;Monitored during session;Repositioned        Extremity/Trunk Assessment Upper Extremity Assessment Upper Extremity Assessment: LUE deficits/detail LUE Deficits / Details: 3/5 GH flexion. 5/5 elbow flexion and extension. Diminshed grip strength (significantly compared to R). LUE: Shoulder pain with ROM;Shoulder pain at rest LUE Sensation: decreased light touch   Lower Extremity Assessment Lower Extremity Assessment: Defer to PT evaluation RLE Deficits / Details: <3/5 hip flexion (unable to achieve full range), 3/5 knee extension and DF. Inability to sustain contraction for MMT. Pt reports hypersensitivity with LT and deep pressure testing. Decreased coordination with heel-to-shin test.       Communication Communication Communication: No difficulties   Cognition Arousal/Alertness: Awake/alert Behavior During Therapy: WFL for tasks assessed/performed;Anxious;Impulsive Overall Cognitive Status: Within Functional Limits for tasks assessed                                 General Comments: A&Ox4. Limited safety awareness. Pt reports  understanding instructions however has difficulty with following direction; limited carryover. Pt reporting she "walked with 2 nurses last night" however nurse does not confirm        Exercises Other Exercises Other Exercises: Pt reporting lightheadedness and dizziness upon sitting up in bed - supine and sitting orthostatics assessed, unable to tolerate standing. Supine>sit, assessment of LE and UE strength/sensation/coordination, STSx3 reps, lateral transfer. Pt education on d/c recs with multiple questions answered regarding d/c options and rehab potential following CVA.        Home Living Family/patient expects to be discharged to:: Private residence Living Arrangements: Alone Available Help at Discharge: Family;Available PRN/intermittently;Friend(s) Type of Home: House Home Access: Stairs to enter [employer] of Steps: 5 Entrance Stairs-Rails: Right (states it is wobbly and not safe to use) Home Layout: One level     Bathroom Shower/Tub: Chief Strategy Officer: Standard Bathroom Accessibility: No   Home Equipment: Rollator (4 wheels)   Additional Comments: Does not have shower chair - sits on side of tub during shower due to decreased standing endurance. Uses Rollator (>1 year) due to decreased walking endurance.      Prior Functioning/Environment Prior Level of Function : Needs assist;History of Falls (last six months)       Physical Assist : Mobility (physical);ADLs (physical) Mobility (physical): Stairs ADLs (physical): Bathing;Dressing;IADLs Mobility Comments: Reports Mod I with short distance ambulation and basic transfers. 1 fall reported over the past 6 months. ADLs Comments: Family and friends assist with getting into/out of the shower as well as process of showering, LB dressing, and IADLs - pt does attempt to assist with cooking.  OT Problem List: Decreased strength;Decreased activity tolerance;Impaired balance (sitting and/or  standing);Decreased knowledge of use of DME or AE;Decreased knowledge of precautions;Pain      OT Treatment/Interventions: Self-care/ADL training;Therapeutic exercise;Energy conservation;DME and/or AE instruction;Therapeutic activities;Patient/family education;Balance training    OT Goals(Current goals can be found in the care plan section) Acute Rehab OT Goals Patient Stated Goal: to regain independence OT Goal Formulation: With patient Time For Goal Achievement: 12/07/21 Potential to Achieve Goals: Good ADL Goals Pt Will Transfer to Toilet: with mod assist;with +2 assist;stand pivot transfer;bedside commode Pt Will Perform Toileting - Clothing Manipulation and hygiene: with mod assist;with 2+ total assist;sit to/from stand Pt/caregiver will Perform Home Exercise Program: Increased strength;Left upper extremity;Independently;With written HEP provided  OT Frequency: Min 5X/week           Co-evaluation PT/OT/SLP Co-Evaluation/Treatment: Yes Reason for Co-Treatment: For patient/therapist safety;To address functional/ADL transfers PT goals addressed during session: Mobility/safety with mobility OT goals addressed during session: ADL's and self-care      AM-PAC OT "6 Clicks" Daily Activity     Outcome Measure Help from another person eating meals?: None Help from another person taking care of personal grooming?: A Little Help from another person toileting, which includes using toliet, bedpan, or urinal?: A Lot Help from another person bathing (including washing, rinsing, drying)?: A Lot Help from another person to put on and taking off regular upper body clothing?: A Little Help from another person to put on and taking off regular lower body clothing?: A Lot 6 Click Score: 16   End of Session Equipment Utilized During Treatment: Gait belt;Rolling walker (2 wheels) Nurse Communication: Mobility status  Activity Tolerance: Patient tolerated treatment well Patient left: in chair;with  call bell/phone within reach;with chair alarm set  OT Visit Diagnosis: Unsteadiness on feet (R26.81);Muscle weakness (generalized) (M62.81)                Time: 5621-3086 OT Time Calculation (min): 23 min Charges:  OT General Charges $OT Visit: 1 Visit OT Evaluation $OT Eval Moderate Complexity: 1 Mod OT Treatments $Self Care/Home Management : 8-22 mins  Matthew Folks, OTR/L [facility] [phone]

## 2021-11-23 NOTE — Progress Notes (Signed)
Inpatient Rehab Admissions Coordinator Note:   Per PT/OT patient was screened for CIR candidacy by Vivianna Piccini Luvenia Starch, CCC-SLP. At this time, pt appears to be a potential candidate for CIR. I will place an order for rehab consult for full assessment, per our protocol.  Please contact me any with questions.Estill Dooms, PT, DPT [phone] 11/23/21 4:17 PM

## 2021-11-23 NOTE — Consult Note (Signed)
Neurology Consultation Reason for Consult: stroke Referring Physician: Wilfred Lacy  CC: Leg weakness  History is obtained from: Patient  HPI: Kristin Burgess is a 60 y.o. female who initially started having problems with her right leg on Thanksgiving day.  She states that it was mild, but she would have to drag it along.  When she woke up yesterday morning, it had gotten markedly worse and she was able to stand on it and therefore presented to the emergency department.  She also has some tingling of her fingertips as well on the left side.  She denies any vision changes.  She denies any lightheadedness, or episodes of syncope.  She was not lightheaded at the time of onset of the symptoms on Thanksgiving day.  She was asleep at the time of the worsening of symptoms overnight Friday night.   LKW: Thanksgiving day tpa given?: no, out of window   ROS: A 14 point ROS was performed and is negative except as noted in the HPI.   Past Medical History:  Diagnosis Date   AVM (arteriovenous malformation) brain    Brain bleed (HCC)    Depression    Hyperlipidemia    Hypertension      Family History  Problem Relation Age of Onset   Lung cancer Maternal Grandmother    Colon cancer Maternal Grandfather    Kidney cancer Neg Hx    Bladder Cancer Neg Hx      Social History:  reports that she has been smoking cigarettes. She has been smoking an average of .25 packs per day. She has never used smokeless tobacco. She reports that she does not drink alcohol and does not use drugs.   Exam: Current vital signs: BP (!) 164/78 (BP Location: Left Arm)   Pulse (!) 59   Temp 98.6 F (37 C) (Oral)   Resp 18   Ht 5\' 5"  (1.651 m)   Wt 119.7 kg   SpO2 97%   BMI 43.93 kg/m  Vital signs in last 24 hours: Temp:  [97.6 F (36.4 C)-98.6 F (37 C)] 98.6 F (37 C) (12/04 1133) Pulse Rate:  [57-84] 59 (12/04 1133) Resp:  [14-22] 18 (12/04 1133) BP: (130-169)/(42-99) 164/78 (12/04 1133) SpO2:  [95  %-100 %] 97 % (12/04 1133)   Physical Exam  Constitutional: Appears well-developed and well-nourished.  Psych: Affect appropriate to situation Eyes: No scleral injection HENT: No OP obstruction MSK: no joint deformities.  Cardiovascular: Normal rate and regular rhythm.  Respiratory: Effort normal, non-labored breathing GI: Soft.  No distension. There is no tenderness.  Skin: WDI  Neuro: Mental Status: Patient is awake, alert, oriented to person, place, month, year, and situation. Patient is able to give a clear and coherent history. No signs of aphasia or neglect Cranial Nerves: II: Visual Fields are full. Pupils are equal, round, and reactive to light.   III,IV, VI: EOMI without ptosis or diploplia.  V: Facial sensation is symmetric to temperature VII: Facial movement is symmetric.  VIII: hearing is intact to voice X: Uvula elevates symmetrically XI: Shoulder shrug is symmetric. XII: tongue is midline without atrophy or fasciculations.  Motor: Tone is normal. Bulk is normal. 5/5 strength was present in bilateral upper extremities with no drift.  In the right lower extremity she has significant weakness affecting extensors more than flexors,  3/5.  In the left lower extremity she had good 5/5 strength Sensory: Sensation is symmetric to light touch and temperature in the arms and slightly diminished in  the right leg Cerebellar: No clear ataxia, cannot perform heel-knee-shin with her right leg due to weakness    I have reviewed labs in epic and the results pertinent to this consultation are: Cr 0.67 LDL 87.   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  Impression: 59 year old female admitted with leg weakness due to ischemic infarcts.  I wonder if she had a nonocclusive embolic event on Thanksgiving resulting in the initial symptoms followed by  dropping her pressures overnight Friday night resulting in extension.  Recommendations: 1) ASA 81mg  daily an dplavix 75mg  daily for 3 weeks after 300mg  load 2) Echo, telemetry 3) increase atorvastatin to 80mg  qhs to aim for goal < 70 4) PT,OT,ST 5) could cautiously lower BP over the next few days 6) Will follow.    , MD [facility] [phone]  If 7pm- 7am, please page neurology on call as listed in [facility].

## 2021-11-24 LAB — BASIC METABOLIC PANEL
Anion gap: 7 (ref 5–15)
BUN: 10 mg/dL (ref 6–20)
CO2: 25 mmol/L (ref 22–32)
Calcium: 8.7 mg/dL — ABNORMAL LOW (ref 8.9–10.3)
Chloride: 108 mmol/L (ref 98–111)
Creatinine, Ser: 0.68 mg/dL (ref 0.44–1.00)
GFR, Estimated: >60 mL/min (ref >60)
Glucose, Bld: 98 mg/dL (ref 70–99)
Potassium: 3.7 mmol/L (ref 3.5–5.1)
Sodium: 140 mmol/L (ref 135–145)

## 2021-11-24 LAB — CBC
HCT: 39.9 % (ref 36.0–46.0)
Hemoglobin: 12.2 g/dL (ref 12.0–15.0)
MCH: 20.7 pg — ABNORMAL LOW (ref 26.0–34.0)
MCHC: 30.6 g/dL (ref 30.0–36.0)
MCV: 67.7 fL — ABNORMAL LOW (ref 80.0–100.0)
Platelets: 220 10*3/uL (ref 150–400)
RBC: 5.89 MIL/uL — ABNORMAL HIGH (ref 3.87–5.11)
RDW: 15.9 % — ABNORMAL HIGH (ref 11.5–15.5)
WBC: 4.6 10*3/uL (ref 4.0–10.5)
nRBC: 0 % (ref 0.0–0.2)

## 2021-11-24 LAB — HEMOGLOBIN A1C
Hgb A1c MFr Bld: 6 % — ABNORMAL HIGH (ref 4.8–5.6)
Hgb A1c MFr Bld: 5.9 % — ABNORMAL HIGH (ref 4.8–5.6)
Mean Plasma Glucose: 126 mg/dL
Mean Plasma Glucose: 123 mg/dL

## 2021-11-24 MED ORDER — ALPRAZOLAM 0.25 MG PO TABS
0.2500 mg | ORAL_TABLET | Freq: Two times a day (BID) | ORAL | Status: DC | PRN
  Administered 2021-11-24 – 2021-11-26 (×4): 0.25 mg via ORAL
  Filled 2021-11-24 (×4): qty 1

## 2021-11-24 NOTE — Progress Notes (Signed)
Inpatient Rehab Admissions Coordinator:   I spoke with pt. And her caregiver Nelva Bush over the phone and they confirmed that they would like to pursue CIR admit for the Pt. I will follow for potential admission later this week.   Megan Salon, MS, CCC-SLP Rehab Admissions Coordinator  [phone] (celll) [phone] (office)

## 2021-11-24 NOTE — Progress Notes (Signed)
Occupational Therapy Treatment Patient Details Name: Kristin Burgess MRN: [id number] DOB: 04-Jul-1961 Today's Date: 11/24/2021   History of present illness 60 y.o. female with history of hypertension, tobacco use, seizures, AVM, major depression, morbid obesity, who presents with right leg weakness. MRI indicates scattered small acute infarcts in the left frontoparietal lobes much greater than right frontal lobe and chronic right parietal infarct.   OT comments  Ms. Todisco seen for OT treatment on this date. Upon arrival to room pt awake/alert. Pt upright in bed with visitors present.  Pt agreeable to tx.  Pt MOD A+ 2 + RW + Vcs for 2 STS trials, taking 2 lateral steps in each trial for a total of 4 steps, pt with poor control of RLE, but good effort, Vcs for hand postioning. Pt requiring MAX A +2, SPT to BSC. Perihygiene requires MAX A + 2, standing ~ 1 min. SETUP + SBA for toothbrushing, seated EOB ~ 5 min. Vcs needed for hand positioning, safe DME use. Pt making good progress toward goals. Pt continues to benefit from skilled OT services to maximize return to PLOF and minimize risk of future falls, injury, caregiver burden, and readmission. Will continue to follow POC. Discharge recommendation remains appropriate.     Recommendations for follow up therapy are one component of a multi-disciplinary discharge planning process, led by the attending physician.  Recommendations may be updated based on patient status, additional functional criteria and insurance authorization.    Follow Up Recommendations  Acute inpatient rehab (3hours/day)    Assistance Recommended at Discharge Frequent or constant Supervision/Assistance  Equipment Recommendations  Other (comment) (defer to next venue of care)    Recommendations for Other Services      Precautions / Restrictions Precautions Precautions: Fall Restrictions Weight Bearing Restrictions: No       Mobility Bed Mobility Overal bed mobility: Needs  Assistance Bed Mobility: Supine to Sit;Sit to Supine     Supine to sit: Min assist;HOB elevated Sit to supine: Min assist        Transfers Overall transfer level: Needs assistance Equipment used: Rolling walker (2 wheels) Transfers: Sit to/from Stand Sit to Stand: Mod assist;+2 physical assistance Stand pivot transfers: Max assist;+2 physical assistance         General transfer comment:      Balance Overall balance assessment: Needs assistance Sitting-balance support: No upper extremity supported;Feet supported Sitting balance-Leahy Scale: Good     Standing balance support: Bilateral upper extremity supported;During functional activity;Reliant on assistive device for balance Standing balance-Leahy Scale: Poor                             ADL either performed or assessed with clinical judgement   ADL Overall ADL's : Needs assistance/impaired                                       General ADL Comments: Pt MAX A +2, SPT to BSC. Perihygiene requires MAX A + 2, standing ~ 1 min. SETUP + SBA for toothbrushing, seated EOB ~ 5 min.    Extremity/Trunk Assessment              Vision       Perception     Praxis      Cognition Arousal/Alertness: Awake/alert Behavior During Therapy: WFL for tasks assessed/performed; Overall Cognitive Status: Within Functional Limits for tasks  assessed                                 General Comments:            Exercises Exercises: Other exercises  Other Exercises Other Exercises: Pt educ re: CIR information, d/c recs, falls prevention, DME use Other Exercises: Sup<>sit, sit<>stand, lateral steps along EOB, grooming- toothbrushing, toilet t/f to [facility]   Shoulder Instructions       General Comments      Pertinent Vitals/ Pain       Pain Assessment: No/denies pain  Home Living                                      Lives With: Alone    Prior  Functioning/Environment              Frequency  Min 5X/week        Progress Toward Goals  OT Goals(current goals can now be found in the care plan section)  Progress towards OT goals: Progressing toward goals  Acute Rehab OT Goals Patient Stated Goal: to go home OT Goal Formulation: With patient Time For Goal Achievement: 12/07/21 Potential to Achieve Goals: Good ADL Goals Pt Will Transfer to Toilet: with mod assist;with +2 assist;stand pivot transfer;bedside commode Pt Will Perform Toileting - Clothing Manipulation and hygiene: with mod assist;with 2+ total assist;sit to/from stand Pt/caregiver will Perform Home Exercise Program: Increased strength;Left upper extremity;Independently;With written HEP provided  Plan Discharge plan remains appropriate;Frequency remains appropriate    Co-evaluation                 AM-PAC OT "6 Clicks" Daily Activity     Outcome Measure   Help from another person eating meals?: None Help from another person taking care of personal grooming?: A Little Help from another person toileting, which includes using toliet, bedpan, or urinal?: A Lot Help from another person bathing (including washing, rinsing, drying)?: A Lot Help from another person to put on and taking off regular upper body clothing?: A Little Help from another person to put on and taking off regular lower body clothing?: A Lot 6 Click Score: 16    End of Session Equipment Utilized During Treatment: Gait belt;Rolling walker (2 wheels)  OT Visit Diagnosis: Unsteadiness on feet (R26.81);Muscle weakness (generalized) (M62.81)   Activity Tolerance Patient tolerated treatment well   Patient Left in bed;with call bell/phone within reach;with bed alarm set;with family/visitor present   Nurse Communication          Time: 0217-0244 OT Time Calculation (min): 27 min  Charges: OT General Charges $OT Visit: 1 Visit OT Treatments $Self Care/Home Management : 23-37  mins [place] [facility], Adine Madura 11/24/2021, 4:00 PM

## 2021-11-24 NOTE — Progress Notes (Signed)
Patient refusing bed alarm at present time. Floor mats in place and bed in low position. Patient verbalizes understanding of risks associated with falls and nonadhering to fall precautions.

## 2021-11-24 NOTE — PMR Pre-admission (Signed)
PMR Admission Coordinator Pre-Admission Assessment  Patient: Kristin Burgess is an 60 y.o., female MRN: [id number] DOB: [birth year] Height: 5\' 5"  (165.1 cm) Weight: 119.7 kg  Insurance Information HMO:     PPO:      PCP:      IPA:      80/20: no     OTHER:  PRIMARY: Medicare       Policy#:   [id number]    Subscriber: Pt. Phone#: Verified online    Fax#:  Pre-Cert#:       Employer:  Benefits:  Phone #:      Name:  Eff. Date: Parts A ad B effective  Deduct: $1556      Out of Pocket Max:  None      Life Max: N/A  CIR: 100%      SNF: 100 days Outpatient: 80%     Co-Pay: 20% Home Health: 100%      Co-Pay: none DME: 80%     Co-Pay: 20% Providers: patient's choice SECONDARY: Medicaid of [place]      Policy#: [id number]     Phone#:  Financial Counselor:       Phone#:   The Therapist, art Information Summary" for patients in Inpatient Rehabilitation Facilities with attached "Privacy Act Benjamin Perez Records" was provided and verbally reviewed with: Patient  Emergency Contact Information Contact Information     Name Relation Home Work Mobile   Milhouse,Geraldine Mother [phone]         Current Medical History  Patient Admitting Diagnosis: CVA   History of Present Illness:  Kristin Burgess is a 60 year old right-handed female with history of hypertension, tobacco use, AVM, with surgery 2015, seizure disorder maintained on Lamictal depression, morbid obesity BMI 39.93, .  Per chart review patient lives alone.  1 level home 5 steps to entry.  Modified independent for ambulation.  Family and friends assist with ADLs.  Presented to [facility] 11/22/2021 with progressive right leg weakness over an 8-day period as well as a recent fall when getting out of bed without loss of conscious.  She denied any lightheadedness or syncope.  Cranial CT scan showed subtle hypodensity of the deep white matter of the left parietal lobe consistent with infarction although age-indeterminate.  Unchanged nonacute  encephalomalacia of the right parietal vertex.  CT angiogram head and neck no large vessel occlusion or significant stenosis.  No evidence of recurrent AVM.  MRI scattered small acute infarcts in the parasagittal left frontal parietal lobe much greater than right frontal lobe representing ACA/MCA watershed infarcts.  Patient did not receive tPA.  Echocardiogram with ejection fraction of 60 to 65% no wall motion abnormalities grade 1 diastolic dysfunction.  Admission chemistries unremarkable glucose 107, alcohol negative, hemoglobin A1c 5.9.  Neurology follow-up currently maintained on aspirin 81 mg daily and Plavix 75 mg daily for CVA prophylaxis x3 weeks then aspirin alone.  Subcutaneous Lovenox for DVT prophylaxis.  Tolerating a regular consistency diet.  Therapy evaluations completed due to patient's right side weakness and decreased functional mobility was admitted for a comprehensive rehab program.   Complete NIHSS TOTAL: 3  Patient's medical record from [facility] has been reviewed by the rehabilitation admission coordinator and physician.  Past Medical History  Past Medical History:  Diagnosis Date   AVM (arteriovenous malformation) brain    Brain bleed (HCC)    Depression    Hyperlipidemia    Hypertension     Has the patient had major surgery during 100 days prior to  Depression     Hyperlipidemia     Hypertension        Has the patient had major surgery during 100 days prior to admission? No   Family History   family history includes Colon cancer in her maternal grandfather; Lung cancer in her maternal grandmother.   Current Medications   Current Facility-Administered Medications:    acetaminophen (TYLENOL) tablet 650 mg, 650 mg, Oral, Q4H PRN **OR** acetaminophen (TYLENOL) 160 MG/5ML solution 650 mg, 650 mg, Per Tube, Q4H PRN **OR** acetaminophen (TYLENOL) suppository 650 mg, 650 mg, Rectal, Q4H PRN, Eckstat, Matthew M, MD   ALPRAZolam (XANAX) tablet 0.25 mg, 0.25 mg, Oral, BID PRN, Williams, Jamiese M, MD, 0.25 mg at 11/26/21 0927   amLODipine  (NORVASC) tablet 10 mg, 10 mg, Oral, Daily, Eckstat, Matthew M, MD, 10 mg at 11/25/21 0846   aspirin EC tablet 81 mg, 81 mg, Oral, Daily, Kirkpatrick, McNeill P, MD, 81 mg at 11/26/21 0939   atorvastatin (LIPITOR) tablet 80 mg, 80 mg, Oral, Daily, Kirkpatrick, McNeill P, MD, 80 mg at 11/26/21 0940   clopidogrel (PLAVIX) tablet 75 mg, 75 mg, Oral, Daily, Kirkpatrick, McNeill P, MD, 75 mg at 11/26/21 0939   darifenacin (ENABLEX) 24 hr tablet 15 mg, 15 mg, Oral, Daily, Eckstat, Matthew M, MD, 15 mg at 11/26/21 0939   docusate sodium (COLACE) capsule 200 mg, 200 mg, Oral, BID, Williams, Jamiese M, MD, 200 mg at 11/26/21 0939   DULoxetine (CYMBALTA) DR capsule 30 mg, 30 mg, Oral, Daily, Eckstat, Matthew M, MD, 30 mg at 11/26/21 0940   enoxaparin (LOVENOX) injection 40 mg, 40 mg, Subcutaneous, Q24H, Eckstat, Matthew M, MD, 40 mg at 11/25/21 2158   gabapentin (NEURONTIN) capsule 300 mg, 300 mg, Oral, QPM, Eckstat, Matthew M, MD, 300 mg at 11/25/21 1754   hydrALAZINE (APRESOLINE) injection 20 mg, 20 mg, Intravenous, Q8H PRN, Williams, Jamiese M, MD, 20 mg at 11/23/21 1526   hydrochlorothiazide (HYDRODIURIL) tablet 12.5 mg, 12.5 mg, Oral, Daily, Eckstat, Matthew M, MD, 12.5 mg at 11/25/21 0846   lamoTRIgine (LAMICTAL XR) 24 hour tablet 100 mg, 100 mg, Oral, QHS, Eckstat, Matthew M, MD, 100 mg at 11/25/21 2157   propranolol (INDERAL) tablet 10 mg, 10 mg, Oral, BID, Eckstat, Matthew M, MD, 10 mg at 11/26/21 1001   traZODone (DESYREL) tablet 50 mg, 50 mg, Oral, QHS, Eckstat, Matthew M, MD, 50 mg at 11/25/21 2157   Patients Current Diet:  Diet Order                  Diet Heart Room service appropriate? Yes; Fluid consistency: Thin  Diet effective now                         Precautions / Restrictions Precautions Precautions: Fall Restrictions Weight Bearing Restrictions: No    Has the patient had 2 or more falls or a fall with injury in the past year? No   Prior Activity Level  Pt. Went out  1-2x a week   Prior Functional Level Self Care: Did the patient need help bathing, dressing, using the toilet or eating? Needed some help   Indoor Mobility: Did the patient need assistance with walking from room to room (with or without device)? Needed some help   Stairs: Did the patient need assistance with internal or external stairs (with or without device)? Needed some help   Functional Cognition: Did the patient need help planning regular tasks such as shopping or   Patient's Response To:  Health Literacy and Transportation Is the patient able to respond to health literacy and transportation needs?: Yes Health Literacy - How often do you need to have someone help you when you read instructions, pamphlets, or other written material from your doctor or pharmacy?: Always In the past 12 months, has lack of transportation kept you from medical appointments or from getting medications?: Yes In the past 12 months, has lack of transportation kept you from meetings, work, or from getting things needed for daily living?: Yes  [place] Devices/Equipment: Environmental consultant (specify type) Home Equipment: Rollator (4 wheels)  Prior Device Use: Indicate devices/aids used by the patient prior to current illness, exacerbation or injury? Manual wheelchair and Walker  Current Functional Level Cognition  Overall Cognitive Status: Within Functional Limits for tasks assessed Orientation Level: Oriented X4 General Comments:  (high anxiety this am about d/c)    Extremity Assessment (includes Sensation/Coordination)  Upper Extremity Assessment: LUE deficits/detail LUE Deficits / Details: 3/5 GH flexion. 5/5 elbow flexion and  extension. Diminshed grip strength (significantly compared to R). LUE: Shoulder pain with ROM, Shoulder pain at rest LUE Sensation: decreased light touch  Lower Extremity Assessment: Defer to PT evaluation RLE Deficits / Details: <3/5 hip flexion (unable to achieve full range), 3/5 knee extension and DF. Inability to sustain contraction for MMT. Pt reports hypersensitivity with LT and deep pressure testing. Decreased coordination with heel-to-shin test.    ADLs  Overall ADL's : Needs assistance/impaired Grooming: Wash/dry face, Supervision/safety, Set up, Sitting Lower Body Dressing: Maximal assistance, Sitting/lateral leans Lower Body Dressing Details (indicate cue type and reason): to don socks Toileting- Clothing Manipulation and Hygiene: Maximal assistance, +2 for physical assistance, Sit to/from stand General ADL Comments: Pt SBA  for seated bathing, upper body and lower body~ 10 min. MAX A + 2 to stand, pt able to access LB for perihygiene, standing, ~1 min before self initiating return to sit. Pt SBA + VCs  for don/doff socks and underwear, seated in bed, VCs for technique and saftey. Pt MAX A + 2 + RW +VCs for functional mobility, bed to chair, ambulating, ~ 20 ft.    Mobility  Overal bed mobility: Needs Assistance Bed Mobility: Supine to Sit, Sit to Supine Supine to sit: Min assist, HOB elevated Sit to supine: Min assist General bed mobility comments:  (not assessed, pt up in chair upon arrival)    Transfers  Overall transfer level: Needs assistance Equipment used: Rolling walker (2 wheels) Transfers: Sit to/from Stand Sit to Stand: Mod assist, Max assist Bed to/from chair/wheelchair/BSC transfer type:: Stand pivot Stand pivot transfers: Max assist, +2 physical assistance  Lateral/Scoot Transfers: Min assist, +2 safety/equipment General transfer comment:  (MaxA needed to raise from commode, MaxA to maintain balance while completing hygeine in standing.)    Ambulation / Gait /  Stairs / Wheelchair Mobility  Ambulation/Gait Ambulation/Gait assistance: Min assist, Mod assist Gait Distance (Feet): 5 Feet Assistive device: Rolling walker (2 wheels) Gait Pattern/deviations: Step-to pattern, Trunk flexed, Ataxic General Gait Details:  (Pt ambulated 62ft x 2 requiring moderate blocking of R knee to prevent buckling during weight acceptance.)    Posture / Balance Dynamic Sitting Balance Sitting balance - Comments: Good sitting balance reaching without BOS at EOB. Oberserves to have posterior trunk lean during lateral/scoot transfers Balance Overall balance assessment: Needs assistance Sitting-balance support: No upper extremity supported, Feet supported Sitting balance-Leahy Scale: Good Sitting balance - Comments: Good sitting balance reaching without  BOS at EOB. Oberserves to have posterior trunk lean during lateral/scoot transfers Postural control: Posterior lean Standing balance support: Bilateral upper extremity supported, During functional activity, Reliant on assistive device for balance Standing balance-Leahy Scale: Poor Standing balance comment: MAX A x2 to maintain static standing position utilizing RW for BUE support. Pt had difficulty positioning hands safely/correctly on RW.    Special needs/care consideration Skin WNL and Special service needs none   Previous Home Environment (from acute therapy documentation) Living Arrangements: Alone  Lives With: Alone Available Help at Discharge: Family, Available PRN/intermittently, Friend(s) Type of Home: House Home Layout: One level Home Access: Stairs to enter Entrance Stairs-Rails: Right (states it is wobbly and not safe to use) Entrance Stairs-Number of Steps: 5 Bathroom Shower/Tub: Chiropodist: Standard Bathroom Accessibility: No Home Care Services: No Additional Comments: Does not have shower chair - sits on side of tub during shower due to decreased standing endurance. Uses Rollator  (>1 year) due to decreased walking endurance.  Discharge Living Setting Plans for Discharge Living Setting: Patient's home Type of Home at Discharge: House Discharge Home Layout: One level Discharge Home Access: Stairs to enter Entrance Stairs-Rails: Right Entrance Stairs-Number of Steps: 4 Discharge Bathroom Shower/Tub: Tub/shower unit Discharge Bathroom Toilet: Standard Discharge Bathroom Accessibility: Yes How Accessible: Accessible via walker Does the patient have any problems obtaining your medications?: No  Social/Family/Support Systems Patient Roles: Other (Comment) Contact Information: [phone] Anticipated Caregiver: Jannette Fogo (caregiver/friend) Anticipated Caregiver's Contact Information: [phone] Ability/Limitations of Caregiver: Can provide min-mod A Caregiver Availability: 24/7 Discharge Plan Discussed with Primary Caregiver: Yes Is Caregiver In Agreement with Plan?: Yes Does Caregiver/Family have Issues with Lodging/Transportation while Pt is in Rehab?: No  Goals Patient/Family Goal for Rehab: PT/OT Mod A Expected length of stay: 16-18 days Pt/Family Agrees to Admission and willing to participate: Yes Program Orientation Provided & Reviewed with Pt/Caregiver Including Roles  & Responsibilities: Yes  Decrease burden of Care through IP rehab admission: Specialzed equipment needs, Bowel and bladder program, and Patient/family education  Possible need for SNF placement upon discharge: not anticipated  Patient Condition: I have reviewed medical records from [facility], spoken with CM, and patient. I discussed via phone for inpatient rehabilitation assessment.  Patient will benefit from ongoing PT and OT, can actively participate in 3 hours of therapy a day 5 days of the week, and can make measurable gains during the admission.  Patient will also benefit from the coordinated team approach during an Inpatient Acute Rehabilitation admission.  The  patient will receive intensive therapy as well as Rehabilitation physician, nursing, social worker, and care management interventions.  Due to bladder management, bowel management, safety, skin/wound care, disease management, medication administration, pain management, and patient education the patient requires 24 hour a day rehabilitation nursing.  The patient is currently min-max A  with mobility and basic ADLs.  Discharge setting and therapy post discharge at home with home health is anticipated.  Patient has agreed to participate in the Acute Inpatient Rehabilitation Program and will admit today.  Preadmission Screen Completed By:  Genella Mech, 11/26/2021 10:11 AM ______________________________________________________________________   Discussed status with Dr. Ranell Patrick  on 11/26/21 at 83 and received approval for admission today.  Admission Coordinator:  Genella Mech, CCC-SLP, time 1010/Date 11/26/21   Assessment/Plan: Diagnosis: CVA Does the need for close, 24 hr/day Medical supervision in concert with the patient's rehab needs make it unreasonable for this patient to be served in a less intensive setting? Yes  Co-Morbidities requiring supervision/potential complications: HTN, tobacco use, AVM, seizure disorder, depression, morbid obesity Due to bladder management, bowel management, safety, skin/wound care, disease management, medication administration, pain management, and patient education, does the patient require 24 hr/day rehab nursing? Yes Does the patient require coordinated care of a physician, rehab nurse, PT, OT to address physical and functional deficits in the context of the above medical diagnosis(es)? Yes Addressing deficits in the following areas: balance, endurance, locomotion, strength, transferring, bowel/bladder control, bathing, dressing, feeding, grooming, toileting, and psychosocial support Can the patient actively participate in an intensive therapy program of at least 3  hrs of therapy 5 days a week? Yes The potential for patient to make measurable gains while on inpatient rehab is excellent Anticipated functional outcomes upon discharge from inpatient rehab: supervision PT, supervision OT, independent SLP Estimated rehab length of stay to reach the above functional goals is: 10-14 days Anticipated discharge destination: Home 10. Overall Rehab/Functional Prognosis: excellent   MD Signature: Leeroy Cha, MD

## 2021-11-24 NOTE — Progress Notes (Signed)
Inpatient Rehab Admissions Coordinator:   I spoke with Pt. Regarding potential CIR admit. She stated interest but wants to think about it before making a final decision this afternoon.   Megan Salon, MS, CCC-SLP Rehab Admissions Coordinator  [phone] (celll) [phone] (office)

## 2021-11-24 NOTE — Progress Notes (Signed)
PROGRESS NOTE    Kristin Burgess  NLG:[id number] DOB: Jun 18, 1961 DOA: 11/22/2021 PCP: Serita Grit, MD    Assessment & Plan:   Principal Problem:   Leg weakness Active Problems:   Essential hypertension   H/O arteriovenous malformation (AVM)   History of seizures   Major depressive disorder, recurrent episode with mixed features (HCC)   Morbid obesity (HCC)   Tobacco abuse  CVA: scattered small acute infarcts in left frontoparietal lobes. Neuro consulted. Continue on statin, aspirin & plavix as per neuro. Echo shows EF 60-65%, grade I diastolic dysfunction, small pericardial effusion present, & no atrial level shunt deteched.  PT/OT recs CIR. Pt wants to think over the CIR   HTN: continue on propranolol, amlodipine, & HCTZ  HLD: continue on statin   Neuropathy: continue on gabapentin, duloxetine   Seizure disorder: continue on lamotrigine   Morbid obesity: BMI 43.0. Complicates overall care & prognosis     DVT prophylaxis: lovenox  Code Status: full  Family Communication:  Disposition Plan: depends on PT/OT recs   Level of care: Med-Surg  Status is: Inpatient  Remains inpatient appropriate because: severity of illness, acute CVA     Consultants:  Neuro   Procedures:   Antimicrobials:     Subjective: Pt c/o malaise   Objective: Vitals:   11/23/21 1830 11/23/21 2212 11/24/21 0013 11/24/21 0541  BP:  (!) 147/81 (!) 149/79 (!) 144/83  Pulse:  79 64 63  Resp: 20 16 18 16   Temp:  98.2 F (36.8 C) 98.2 F (36.8 C) 97.9 F (36.6 C)  TempSrc:  Oral Oral Oral  SpO2:  95% 95% 97%  Weight:      Height:        Intake/Output Summary (Last 24 hours) at 11/24/2021 0726 Last data filed at 11/23/2021 1700 Gross per 24 hour  Intake 720 ml  Output --  Net 720 ml   Filed Weights   11/22/21 0924  Weight: 119.7 kg    Examination:  General exam: Appears comfortable  Respiratory system: clear breath sounds b/l Cardiovascular system: S1/S2+.  No rubs or clicks Gastrointestinal system: Abd is soft, NT, obese & hypoactive bowel sounds  Central nervous system: Alert and oriented. Moves all extremities  Psychiatry: Judgement and insight appears normal. Flat mood and affect    Data Reviewed: I have personally reviewed following labs and imaging studies  CBC: Recent Labs  Lab 11/22/21 0927 11/23/21 0803 11/24/21 0446  WBC 5.1 3.9* 4.6  HGB 12.9 12.6 12.2  HCT 42.4 41.5 39.9  MCV 70.0* 69.3* 67.7*  PLT 241 223 220   Basic Metabolic Panel: Recent Labs  Lab 11/22/21 0927 11/23/21 0803 11/24/21 0446  NA 139 138 140  K 3.8 3.8 3.7  CL 107 110 108  CO2 26 23 25   GLUCOSE 107* 107* 98  BUN 13 9 10   CREATININE 0.71 0.67 0.68  CALCIUM 8.7* 8.8* 8.7*   GFR: Estimated Creatinine Clearance: 98.1 mL/min (by C-G formula based on SCr of 0.68 mg/dL). Liver Function Tests: Recent Labs  Lab 11/22/21 1205  AST 17  ALT 15  ALKPHOS 68  BILITOT 0.4  PROT 7.2  ALBUMIN 3.7   No results for input(s): LIPASE, AMYLASE in the last 168 hours. No results for input(s): AMMONIA in the last 168 hours. Coagulation Profile: Recent Labs  Lab 11/22/21 1205  INR 1.1   Cardiac Enzymes: No results for input(s): CKTOTAL, CKMB, CKMBINDEX, TROPONINI in the last 168 hours. BNP (last 3 results)  No results for input(s): PROBNP in the last 8760 hours. HbA1C: No results for input(s): HGBA1C in the last 72 hours. CBG: No results for input(s): GLUCAP in the last 168 hours. Lipid Profile: Recent Labs    11/22/21 1205  CHOL 152  HDL 57  LDLCALC 87  TRIG 39  CHOLHDL 2.7   Thyroid Function Tests: No results for input(s): TSH, T4TOTAL, FREET4, T3FREE, THYROIDAB in the last 72 hours. Anemia Panel: No results for input(s): VITAMINB12, FOLATE, FERRITIN, TIBC, IRON, RETICCTPCT in the last 72 hours. Sepsis Labs: No results for input(s): PROCALCITON, LATICACIDVEN in the last 168 hours.  Recent Results (from the past 240 hour(s))  Resp  Panel by RT-PCR (Flu A&B, Covid) Nasopharyngeal Swab     Status: None   Collection Time: 11/22/21 12:05 PM   Specimen: Nasopharyngeal Swab; Nasopharyngeal(NP) swabs in vial transport medium  Result Value Ref Range Status   SARS Coronavirus 2 by RT PCR NEGATIVE NEGATIVE Final    Comment: (NOTE) SARS-CoV-2 target nucleic acids are NOT DETECTED.  The SARS-CoV-2 RNA is generally detectable in upper respiratory specimens during the acute phase of infection. The lowest concentration of SARS-CoV-2 viral copies this assay can detect is 138 copies/mL. A negative result does not preclude SARS-Cov-2 infection and should not be used as the sole basis for treatment or other patient management decisions. A negative result may occur with  improper specimen collection/handling, submission of specimen other than nasopharyngeal swab, presence of viral mutation(s) within the areas targeted by this assay, and inadequate number of viral copies(<138 copies/mL). A negative result must be combined with clinical observations, patient history, and epidemiological information. The expected result is Negative.  Fact Sheet for Patients:  BloggerCourse.com  Fact Sheet for Healthcare Providers:  SeriousBroker.it  This test is no t yet approved or cleared by the [place] FDA and  has been authorized for detection and/or diagnosis of SARS-CoV-2 by FDA under an Emergency Use Authorization (EUA). This EUA will remain  in effect (meaning this test can be used) for the duration of the COVID-19 declaration under Section 564(b)(1) of the Act, 21 U.S.C.section 360bbb-3(b)(1), unless the authorization is terminated  or revoked sooner.       Influenza A by PCR NEGATIVE NEGATIVE Final   Influenza B by PCR NEGATIVE NEGATIVE Final    Comment: (NOTE) The Xpert Xpress SARS-CoV-2/FLU/RSV plus assay is intended as an aid in the diagnosis of influenza from Nasopharyngeal  swab specimens and should not be used as a sole basis for treatment. Nasal washings and aspirates are unacceptable for Xpert Xpress SARS-CoV-2/FLU/RSV testing.  Fact Sheet for Patients: BloggerCourse.com  Fact Sheet for Healthcare Providers: SeriousBroker.it  This test is not yet approved or cleared by the [place] FDA and has been authorized for detection and/or diagnosis of SARS-CoV-2 by FDA under an Emergency Use Authorization (EUA). This EUA will remain in effect (meaning this test can be used) for the duration of the COVID-19 declaration under Section 564(b)(1) of the Act, 21 U.S.C. section 360bbb-3(b)(1), unless the authorization is terminated or revoked.  Performed at [facility], [address] [place], [place] [address]          Radiology Studies: CT ANGIO HEAD NECK W WO CM  Result Date: 11/22/2021 CLINICAL DATA:  CVA EXAM: CT ANGIOGRAPHY HEAD AND NECK TECHNIQUE: Multidetector CT imaging of the head and neck was performed using the standard protocol during bolus administration of intravenous contrast. Multiplanar CT image reconstructions and MIPs were obtained to evaluate  the vascular anatomy. Carotid stenosis measurements (when applicable) are obtained utilizing NASCET criteria, using the distal internal carotid diameter as the denominator. CONTRAST:  75mL OMNIPAQUE IOHEXOL 350 MG/ML SOLN COMPARISON:  February 2018 disease FINDINGS: CTA NECK Aortic arch: Great vessel origins are patent. Right carotid system: Patent. Primarily calcified plaque at the bifurcation and proximal internal carotid without stenosis. Left carotid system: Patent. Primarily calcified plaque at the bifurcation and proximal internal carotid without stenosis. Vertebral arteries: Patent.  Codominant.  No stenosis. Skeleton: Cervical spine degenerative changes. Other neck: Unremarkable. Upper chest: No apical lung mass. Review of the MIP  images confirms the above findings CTA HEAD Anterior circulation: Intracranial internal carotid arteries are patent with mild calcified plaque. There are two small (1-2 mm) inferiorly directed outpouchings from the distal supraclinoid right ICA (series 11, image 25). The more proximal may be contiguous with a small vessel. Anterior cerebral arteries are patent. Anterior communicating artery is present. New focal marked stenosis of the azygous left A2 ACA at the level of the genu of the corpus callosum (series 11, image 26). Middle cerebral arteries are patent. Posterior circulation: Intracranial vertebral arteries are patent with mild calcified plaque. Basilar artery is patent. Major cerebellar artery origins are patent. No evidence of recurrent AVM. Venous sinuses: Patent as allowed by contrast bolus timing. Review of the MIP images confirms the above findings IMPRESSION: No large vessel occlusion.  No significant stenosis in the neck. 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 No evidence of recurrent AVM. Two small inferiorly directed outpouchings from the distal supraclinoid right ICA reflecting infundibula or aneurysms. Electronically Signed   By: Guadlupe Spanish M.D.   On: 11/22/2021 12:50   DG Lumbar Spine Complete  Result Date: 11/22/2021 CLINICAL DATA:  Right leg weakness. EXAM: LUMBAR SPINE - COMPLETE 4+ VIEW COMPARISON:  CT 04/11/2021 FINDINGS: Alignment of the lumbar spine is within normal limits. The vertebral body heights and disc spaces are maintained. Multilevel degenerative endplate changes. Negative for fracture. Negative for a pars defect. IMPRESSION: No acute abnormality in lumbar spine. Electronically Signed   By: Richarda Overlie M.D.   On: 11/22/2021 11:52   CT HEAD WO CONTRAST ( )  Result Date: 11/22/2021 CLINICAL DATA:  Weakness, rule out stroke EXAM: CT HEAD WITHOUT CONTRAST TECHNIQUE: Contiguous axial images were  obtained from the base of the skull through the vertex without intravenous contrast. COMPARISON:  02/09/2017 FINDINGS: Brain: No definite evidence of acute infarction, hemorrhage, hydrocephalus, extra-axial collection or mass lesion/mass effect. Unchanged encephalomalacia of the right parietal vertex (series 2, image 26). There is new, subtle hypodensity of the deep white matter in the left parietal lobe (series 2, image 22). Vascular: No hyperdense vessel or unexpected calcification. Skull: Normal. Negative for fracture or focal lesion. Sinuses/Orbits: No acute finding. Other: None. IMPRESSION: 1. There is new, subtle hypodensity of the deep white matter in the left parietal lobe, consistent with infarction although age indeterminate, most likely subacute to chronic. Consider MRI to more sensitively evaluate for acute diffusion restricting infarction if suspected. 2. Unchanged nonacute encephalomalacia of the right parietal vertex. Electronically Signed   By: Jearld Lesch M.D.   On: 11/22/2021 11:24   MR BRAIN WO CONTRAST  Result Date: 11/22/2021 CLINICAL DATA:  Neuro deficit, acute, stroke suspected EXAM: MRI HEAD WITHOUT CONTRAST TECHNIQUE: Multiplanar, multiecho pulse sequences of the brain and surrounding structures were obtained without intravenous contrast. COMPARISON:  None. FINDINGS:  Motion artifact is present. Brain: Few scattered small foci of restricted diffusion are present in the parasagittal left frontoparietal lobes. Small focus also present within the right frontal lobe. Chronic infarct of the right parietal lobe with chronic blood products. Additional patchy T2 hyperintensity in the supratentorial white matter is nonspecific but may reflect chronic microvascular ischemic changes. Ventricles and sulci are within normal limits in size and configuration. Vascular: Major vessel flow voids at the skull base are preserved. Skull and upper cervical spine: Normal marrow signal is preserved.  Sinuses/Orbits: Paranasal sinuses are aerated. Orbits are unremarkable. Other: Sella is unremarkable.  Mastoid air cells are clear. IMPRESSION: Scattered small acute infarcts in the parasagittal left frontoparietal lobes much greater than right frontal lobe probably representing ACA and ACA/MCA watershed infarcts in the setting of azygous A2 ACA stenosis seen on CTA. Chronic right parietal infarct. Chronic microvascular ischemic changes. Electronically Signed   By: Guadlupe Spanish M.D.   On: 11/22/2021 15:51   ECHOCARDIOGRAM COMPLETE  Result Date: 11/23/2021    ECHOCARDIOGRAM REPORT   Patient Name:   Kristin Burgess Date of Exam: 11/23/2021 Medical Rec #:  [id number]    Height:       65.0 in Accession #:    [id number]   Weight:       264.0 lb Date of Birth:  31-Oct-1961    BSA:          2.226 m Patient Age:    60 years     BP:           153/76 mmHg Patient Gender: F            HR:           70 bpm. Exam Location:  [facility] Procedure: 2D Echo Indications:     Stroke  History:         Patient has no prior history of Echocardiogram examinations.                  Risk Factors:Hypertension, Current Smoker and Morbidly Obese.  Sonographer:     L Thornton-Maynard Referring Phys:  [id number] Mary Sella ECKSTAT Diagnosing Phys: Julien Nordmann MD  Sonographer Comments: Suboptimal subcostal window. IMPRESSIONS  1. Left ventricular ejection fraction, by estimation, is 60 to 65%. The left ventricle has normal function. The left ventricle has no regional wall motion abnormalities. There is moderate left ventricular hypertrophy. Left ventricular diastolic parameters are consistent with Grade I diastolic dysfunction (impaired relaxation).  2. Right ventricular systolic function is normal. The right ventricular size is normal. Tricuspid regurgitation signal is inadequate for assessing PA pressure.  3. Left atrial size was mildly dilated.  4. A small pericardial effusion is present.  5. The mitral valve is normal in structure. Mild mitral valve  regurgitation. No evidence of mitral stenosis.  6. The aortic valve is normal in structure. Aortic valve regurgitation is not visualized. No aortic stenosis is present.  7. The inferior vena cava is normal in size with greater than 50% respiratory variability, suggesting right atrial pressure of 3 mmHg. FINDINGS  Left Ventricle: Left ventricular ejection fraction, by estimation, is 60 to 65%. The left ventricle has normal function. The left ventricle has no regional wall motion abnormalities. The left ventricular internal cavity size was normal in size. There is  moderate left ventricular hypertrophy. Left ventricular diastolic parameters are consistent with Grade I diastolic dysfunction (impaired relaxation). Right Ventricle: The right ventricular size is normal. No increase in right ventricular wall thickness. Right  ventricular systolic function is normal. Tricuspid regurgitation signal is inadequate for assessing PA pressure. Left Atrium: Left atrial size was mildly dilated. Right Atrium: Right atrial size was normal in size. Pericardium: A small pericardial effusion is present. Mitral Valve: The mitral valve is normal in structure. Mild mitral valve regurgitation. No evidence of mitral valve stenosis. Tricuspid Valve: The tricuspid valve is normal in structure. Tricuspid valve regurgitation is not demonstrated. No evidence of tricuspid stenosis. Aortic Valve: The aortic valve is normal in structure. Aortic valve regurgitation is not visualized. No aortic stenosis is present. Aortic valve peak gradient measures 6.1 mmHg. Pulmonic Valve: The pulmonic valve was normal in structure. Pulmonic valve regurgitation is not visualized. No evidence of pulmonic stenosis. Aorta: The aortic root is normal in size and structure. Venous: The inferior vena cava is normal in size with greater than 50% respiratory variability, suggesting right atrial pressure of 3 mmHg. IAS/Shunts: No atrial level shunt detected by color flow  Doppler.  LEFT VENTRICLE PLAX 2D LVIDd:         4.90 cm   Diastology LVIDs:         3.00 cm   LV e' medial:    5.22 cm/s LV PW:         1.40 cm   LV E/e' medial:  14.4 LV IVS:        1.80 cm   LV e' lateral:   5.33 cm/s LVOT diam:     2.00 cm   LV E/e' lateral: 14.1 LV SV:         58 LV SV Index:   26 LVOT Area:     3.14 cm  RIGHT VENTRICLE RV S prime:     16.00 cm/s TAPSE (M-mode): 1.8 cm LEFT ATRIUM             Index LA diam:        4.30 cm 1.93 cm/m LA Vol (A2C):   69.1 ml 31.04 ml/m LA Vol (A4C):   84.7 ml 38.05 ml/m LA Biplane Vol: 76.9 ml 34.55 ml/m  AORTIC VALVE                 PULMONIC VALVE AV Area (Vmax): 1.82 cm     PV Vmax:          1.14 m/s AV Vmax:        123.00 cm/s  PV Peak grad:     5.2 mmHg AV Peak Grad:   6.1 mmHg     PR End Diast Vel: 1.43 msec LVOT Vmax:      71.10 cm/s LVOT Vmean:     57.100 cm/s LVOT VTI:       0.185 m  AORTA Ao Root diam: 3.20 cm Ao Asc diam:  3.30 cm MITRAL VALVE MV Area (PHT): 2.86 cm    SHUNTS MV E velocity: 75.00 cm/s  Systemic VTI:  0.18 m MV A velocity: 90.80 cm/s  Systemic Diam: 2.00 cm MV E/A ratio:  0.83 Julien Nordmann MD Electronically signed by Julien Nordmann MD Signature Date/Time: 11/23/2021/2:00:01 PM    Final    DG Hip Unilat W or Wo Pelvis 2-3 Views Right  Result Date: 11/22/2021 CLINICAL DATA:  Right leg weakness. EXAM: DG HIP (WITH OR WITHOUT PELVIS) 2-3V RIGHT COMPARISON:  None. FINDINGS: Pelvic bony ring is intact. Symmetric appearance of the SI joints. Normal appearance of left hip. Right hip is located without a fracture. No significant joint space narrowing in the hips. IMPRESSION: No acute abnormality.  Electronically Signed   By: Richarda Overlie M.D.   On: 11/22/2021 11:50        Scheduled Meds:  amLODipine  10 mg Oral Daily   aspirin EC  81 mg Oral Daily   atorvastatin  80 mg Oral Daily   clopidogrel  75 mg Oral Daily   darifenacin  15 mg Oral Daily   DULoxetine  30 mg Oral Daily   enoxaparin (LOVENOX) injection  40 mg Subcutaneous  Q24H   gabapentin  300 mg Oral QPM   hydrochlorothiazide  12.5 mg Oral Daily   lamoTRIgine  100 mg Oral QHS   propranolol  10 mg Oral BID   traZODone  50 mg Oral QHS   Continuous Infusions:   LOS: 2 days    Time spent: 30 mins    Charise Killian, MD [facility] Pager [phone]  If 7PM-7AM, please contact night-coverage 11/24/2021, 7:26 AM

## 2021-11-24 NOTE — Progress Notes (Signed)
Physical Therapy Treatment Patient Details Name: Kristin Burgess MRN: [id number] DOB: 26-Jun-1961 Today's Date: 11/24/2021   History of Present Illness 60 y.o. female with history of hypertension, tobacco use, seizures, AVM, major depression, morbid obesity, who presents with right leg weakness. MRI indicates scattered small acute infarcts in the left frontoparietal lobes much greater than right frontal lobe and chronic right parietal infarct.    PT Comments    Therapist in this am to see pt who was sitting up in recliner, very anxious over d/c plans. Questions/concerns addressed and pt with a much clearer picture of acute inpatient rehab and in full agreement to go.  Pt participated in R LE AAROM in sitting requiring assist to attain end range due to continued weakness. Several transfers from sitting to standing at RW with ModA. Initial struggle to attain upright posture and accept support weight through R LE. Pt tolerated one minute stance times demonstrating ability to advance R LE forward and back with poor ankle control. Pt is an excellent acute rehab candidate and remains very motivated.   Recommendations for follow up therapy are one component of a multi-disciplinary discharge planning process, led by the attending physician.  Recommendations may be updated based on patient status, additional functional criteria and insurance authorization.  Follow Up Recommendations  Acute inpatient rehab (3hours/day)     Assistance Recommended at Discharge Frequent or constant Supervision/Assistance  Equipment Recommendations       Recommendations for Other Services       Precautions / Restrictions Precautions Precautions: Fall Restrictions Weight Bearing Restrictions: No     Mobility  Bed Mobility                    Transfers Overall transfer level: Needs assistance Equipment used: Rolling walker (2 wheels) Transfers: Sit to/from Stand Sit to Stand: Mod assist            General transfer comment:  (Sit to stand x 3 from recliner)    Ambulation/Gait                   Stairs             Wheelchair Mobility    Modified Rankin (Stroke Patients Only)       Balance                                            Cognition Arousal/Alertness: Awake/alert Behavior During Therapy: WFL for tasks assessed/performed;Anxious;Impulsive Overall Cognitive Status: Within Functional Limits for tasks assessed                                 General Comments:  (high anxiety this am about d/c)        Exercises General Exercises - Lower Extremity Ankle Circles/Pumps: AAROM;Right;10 reps Long Arc Quad: Right;10 reps;AAROM Hip Flexion/Marching: AAROM;Right;10 reps    General Comments General comments (skin integrity, edema, etc.):  (Long discussion with pt and caregiver regarding differences between SNF and CIR. Once explained, pt was very excited and motivated to transition to CIR if bed available.)      Pertinent Vitals/Pain Pain Assessment: No/denies pain    Home Living                          Prior  Function            PT Goals (current goals can now be found in the care plan section) Acute Rehab PT Goals Patient Stated Goal:  (go to acute rehab) Progress towards PT goals: Progressing toward goals    Frequency    7X/week      PT Plan Current plan remains appropriate    Co-evaluation              AM-PAC PT "6 Clicks" Mobility   Outcome Measure  Help needed turning from your back to your side while in a flat bed without using bedrails?: A Little Help needed moving from lying on your back to sitting on the side of a flat bed without using bedrails?: A Little Help needed moving to and from a bed to a chair (including a wheelchair)?: A Lot Help needed standing up from a chair using your arms (e.g., wheelchair or bedside chair)?: A Lot Help needed to walk in hospital room?:  Total Help needed climbing 3-5 steps with a railing? : Total 6 Click Score: 12    End of Session Equipment Utilized During Treatment: Gait belt Activity Tolerance: Patient tolerated treatment well Patient left: in chair;with call bell/phone within reach;with chair alarm set Nurse Communication: Mobility status;Precautions PT Visit Diagnosis: Unsteadiness on feet (R26.81);Other abnormalities of gait and mobility (R26.89);Muscle weakness (generalized) (M62.81);History of falling (Z91.81);Difficulty in walking, not elsewhere classified (R26.2);Other symptoms and signs involving the nervous system (R29.898);Hemiplegia and hemiparesis Hemiplegia - Right/Left: Right Hemiplegia - dominant/non-dominant: Dominant Hemiplegia - caused by: Cerebral infarction     Time: 1130-1218 PT Time Calculation (min) (ACUTE ONLY): 48 min  Charges:  $Therapeutic Exercise: 8-22 mins $Therapeutic Activity: 23-37 mins                    Zadie Cleverly, PTA    Jannet Askew 11/24/2021, 2:08 PM

## 2021-11-24 NOTE — TOC Initial Note (Addendum)
Transition of Care [facility]) - Initial/Assessment Note    Patient Details  Name: Kristin Burgess MRN: [id number] Date of Birth: 1961/12/20  Transition of Care [facility]) CM/SW Contact:    Caryn Section, RN Phone Number: 11/24/2021, 10:13 AM  Clinical Narrative:    Patient lives alone, however she will stay with her mother, who can assist.  She will also have family members 2 brothers, daughter and son can also assist. Family can drive patient to appointments and pharmacy.  Patient has no concerns with taking medications as directed.  Patient refuses SNF or CIR.  She would like to return home with home health.  Care team advised of this decision.  Patient will use [facility] [address], [place], entered into patient's chart  Patient will need DME to return home, message left with care team.  TOC to follow for needs, TOC contact information provided.            Addendum 10:42:  PT in to speak with patient, patient states she will go to CIR.  CIR to contact patient.  Expected Discharge Plan:  (refuses SNF) Barriers to Discharge: Continued Medical Work up   Patient Goals and CMS Choice        Expected Discharge Plan and Services Expected Discharge Plan:  (refuses SNF)   Discharge Planning Services: CM Consult Post Acute Care Choice: Home Health Living arrangements for the past 2 months: Single Family Home                                      Prior Living Arrangements/Services Living arrangements for the past 2 months: Single Family Home Lives with:: Self (Will discharge home with mother in [place]) Patient language and need for interpreter reviewed:: Yes (No need for interpreter) Do you feel safe going back to the place where you live?: Yes (She is going home with mother)      Need for Family Participation in Patient Care: Yes (Comment) Care giver support system in place?: Yes (comment)   Criminal Activity/Legal Involvement Pertinent to Current Situation/Hospitalization:  No - Comment as needed  Activities of Daily Living Home Assistive Devices/Equipment: Walker (specify type) ADL Screening (condition at time of admission) Patient's cognitive ability adequate to safely complete daily activities?: Yes Is the patient deaf or have difficulty hearing?: No Does the patient have difficulty seeing, even when wearing glasses/contacts?: No Does the patient have difficulty concentrating, remembering, or making decisions?: Yes Patient able to express need for assistance with ADLs?: Yes Does the patient have difficulty dressing or bathing?: Yes Independently performs ADLs?: No Communication: Independent Dressing (OT): Needs assistance Is this a change from baseline?: Pre-admission baseline Grooming: Needs assistance Is this a change from baseline?: Pre-admission baseline Feeding: Independent Bathing: Needs assistance Is this a change from baseline?: Pre-admission baseline Toileting: Needs assistance Is this a change from baseline?: Pre-admission baseline In/Out Bed: Needs assistance Is this a change from baseline?: Pre-admission baseline Walks in Home: Independent Does the patient have difficulty walking or climbing stairs?: No Weakness of Legs: Right Weakness of Arms/Hands: None  Permission Sought/Granted Permission sought to share information with : Case Manager Permission granted to share information with : Yes, Verbal Permission Granted     Permission granted to share info w AGENCY: Home health agency        Emotional Assessment Appearance:: Appears stated age Attitude/Demeanor/Rapport: Engaged Affect (typically observed): Appropriate, Pleasant Orientation: : Oriented to  Self, Oriented to Place, Oriented to  Time, Oriented to Situation Alcohol / Substance Use: Not Applicable Psych Involvement: No (comment)  Admission diagnosis:  Weakness [R53.1] Leg weakness [R29.898] Cerebrovascular accident (CVA), unspecified mechanism (HCC) [I63.9] Patient  Active Problem List   Diagnosis Date Noted   Leg weakness 11/22/2021   Major depressive disorder, recurrent episode with mixed features (HCC) 08/18/2017   Urinary incontinence 05/24/2017   History of seizures 04/02/2017   Mild obstructive sleep apnea 01/14/2016   Pseudobulbar affect 10/25/2015   Restless leg syndrome 10/25/2015   Essential hypertension 05/16/2015   Tobacco abuse 05/16/2015   Morbid obesity (HCC) 09/07/2014   H/O arteriovenous malformation (AVM) 11/09/2013   PCP:  Serita Grit, MD Pharmacy:   [facility] - [place], [place] - [address] [place] [address] Phone: [phone] Fax: [phone]     Social Determinants of Health (SDOH) Interventions    Readmission Risk Interventions No flowsheet data found.

## 2021-11-25 LAB — BASIC METABOLIC PANEL
Anion gap: 7 (ref 5–15)
BUN: 16 mg/dL (ref 6–20)
CO2: 26 mmol/L (ref 22–32)
Calcium: 9.2 mg/dL (ref 8.9–10.3)
Chloride: 107 mmol/L (ref 98–111)
Creatinine, Ser: 0.71 mg/dL (ref 0.44–1.00)
GFR, Estimated: >60 mL/min (ref >60)
Glucose, Bld: 95 mg/dL (ref 70–99)
Potassium: 3.9 mmol/L (ref 3.5–5.1)
Sodium: 140 mmol/L (ref 135–145)

## 2021-11-25 LAB — CBC
HCT: 40 % (ref 36.0–46.0)
Hemoglobin: 12.3 g/dL (ref 12.0–15.0)
MCH: 21.1 pg — ABNORMAL LOW (ref 26.0–34.0)
MCHC: 30.8 g/dL (ref 30.0–36.0)
MCV: 68.6 fL — ABNORMAL LOW (ref 80.0–100.0)
Platelets: 225 10*3/uL (ref 150–400)
RBC: 5.83 MIL/uL — ABNORMAL HIGH (ref 3.87–5.11)
RDW: 15.8 % — ABNORMAL HIGH (ref 11.5–15.5)
WBC: 4.9 10*3/uL (ref 4.0–10.5)
nRBC: 0 % (ref 0.0–0.2)

## 2021-11-25 MED ORDER — DOCUSATE SODIUM 100 MG PO CAPS
200.0000 mg | ORAL_CAPSULE | Freq: Two times a day (BID) | ORAL | Status: DC
  Administered 2021-11-25 – 2021-11-26 (×3): 200 mg via ORAL
  Filled 2021-11-25 (×3): qty 2

## 2021-11-25 NOTE — Progress Notes (Deleted)
Neurology plan of care  Patient amenable to CIR, they will contact patient. TTE w/ no e/o intracardiac clot.   Updated recommendations: 1) ASA 81mg  daily + plavix 75mg  daily x21 days f/b ASA 81mg  daily monotherapy after that 2) Atorvastatin 80mg  qhs 3) PT/OT/SLP 4) cautiously lower BP over next few days to goal of normotension; strict avoidance of hypotension 5) I will arrange outpatient f/u with stroke clinic  No further neurologic workup required as inpatient. Neurology will not continue to actively follow, but please re-engage if additional questions arise.  , MD [facility] [phone]  If 7pm- 7am, please page neurology on call as listed in AMION.

## 2021-11-25 NOTE — Progress Notes (Signed)
Inpatient Rehab Admissions Coordinator:   I do not have a bed for this Pt. Today on CIR but will follow for potential admit pending bed availability.  Megan Salon, MS, CCC-SLP Rehab Admissions Coordinator  [phone] (celll) [phone] (office)

## 2021-11-25 NOTE — Progress Notes (Signed)
PROGRESS NOTE   HPI was taken from Dr. Crissie Reese: Kristin Burgess is a 60 y.o. female with history of hypertension, tobacco use, seizures, AVM, major depression, morbid obesity, who presents with leg weakness.   Patient reports that she has felt weakness and heaviness in her right leg since Thanksgiving day (8 days prior).  Then this morning she fell as she was getting out of bed which is what prompted her to present to the ED.  Prior to this she has had pain in bilateral legs and swelling but the weakness has been new.  She does not describe the sensation of her right leg as numbness or tingling, merely a heaviness like it is difficult to move.  Denies any other weakness, numbness, tingling anywhere else in her body.  Denies any chest pain, palpitations.  She does endorse smoking a pack of cigarettes about every 3 days.  Her father passed away from a stroke.   In the ED initial vital signs notable only for mild hypertension.  Lab work-up showed unremarkable CMP and BMP.  Unremarkable cholesterol studies.  EKG was sinus, no acute ischemic changes, overall appeared similar to prior.  Noncon CT head was obtained which showed a new hypodensity in the left parietal lobe, likely subacute to chronic in age.  She also underwent a plain film study of the lumbar spine and hips which were negative for any acute findings.  She also had a CTA head and neck which showed no large vessel occlusion or significant stenosis in the neck but there is new marked stenosis of the left A2 ACA.  She was then admitted for further management, concern for stroke.   Hospital course from Dr. Mayford Knife 12/4-12/6/22: Pt presented w/ CVA, scattered small acute infarcts in left frontoparietal lobes as per MRI. Neuro was consulted and started the pt on aspirin, plavix as pt was previously on a statin. Echo was done it showed EF 60-65%, grade I diastolic dysfunction, no atrial level shunt detected and small pericardial effusion present. Pt will  need to f/u outpatient w/ cardio to f/u on grade I diastolic dysfunction as well as the small pericardial effusion. Pt verbalized her understanding. Pt did not requirement supplemental oxygen during her hospital stay so far and pt denied any chest pain. PT/OT evaluated the pt and recommended CIR. Pt is waiting on CIR bed currently. For more information, please see previous progress/consult notes.    Kristin Burgess  RUE:[id number] DOB: 1961/01/24 DOA: 11/22/2021 PCP: Serita Grit, MD    Assessment & Plan:   Principal Problem:   Leg weakness Active Problems:   Essential hypertension   H/O arteriovenous malformation (AVM)   History of seizures   Major depressive disorder, recurrent episode with mixed features (HCC)   Morbid obesity (HCC)   Tobacco abuse  CVA: scattered small acute infarcts in left frontoparietal lobes. Neuro consulted. Continue on statin, aspirin & plavix as per neuro. Echo shows EF 60-65%, grade I diastolic dysfunction, small pericardial effusion present, & no atrial level shunt deteched. Will need to f/u outpatient w/ cardio for above echo findings.  PT/OT recs CIR. Waiting on CIR bed still   HTN: continue on amlodipine, propranolol & HCTZ.  HLD: continue on statin   Neuropathy: continue on duloxetine, gabapentin   Seizure disorder: continue on lamotrigine   Morbid obesity: BMI 43.9. Complicates overall care & prognosis    DVT prophylaxis: lovenox  Code Status: full  Family Communication:  Disposition Plan: likely d/c to CIR  Level of care: Med-Surg  Status is: Inpatient  Remains inpatient appropriate because: waiting on CIR bed currently      Consultants:  Neuro   Procedures:   Antimicrobials:     Subjective: Pt c/o fatigue   Objective: Vitals:   11/24/21 1632 11/24/21 1943 11/25/21 0045 11/25/21 0459  BP: (!) 158/79 (!) 158/78 (!) 152/80 (!) 157/84  Pulse: 64 81 70 (!) 55  Resp: 18 20 18 16   Temp: 97.9 F (36.6 C) 97.9 F  (36.6 C) 98 F (36.7 C) 97.9 F (36.6 C)  TempSrc:  Oral Oral Oral  SpO2: 96% 98% 98% 92%  Weight:      Height:        Intake/Output Summary (Last 24 hours) at 11/25/2021 0732 Last data filed at 11/24/2021 1436 Gross per 24 hour  Intake 360 ml  Output --  Net 360 ml   Filed Weights   11/22/21 0924  Weight: 119.7 kg    Examination:  General exam: Appears calm & comfortable  Respiratory system: clear breath sounds b/l. No rales  Cardiovascular system: S1 & S2+. No rubs or clicks  Gastrointestinal system: Abd is soft, NT, obese & hypoactive bowel sounds  Central nervous system: Alert and oriented. Moves all extremities Psychiatry: Judgement and insight appears normal. Flat mood and affect     Data Reviewed: I have personally reviewed following labs and imaging studies  CBC: Recent Labs  Lab 11/22/21 0927 11/23/21 0803 11/24/21 0446 11/25/21 0448  WBC 5.1 3.9* 4.6 4.9  HGB 12.9 12.6 12.2 12.3  HCT 42.4 41.5 39.9 40.0  MCV 70.0* 69.3* 67.7* 68.6*  PLT 241 223 220 225   Basic Metabolic Panel: Recent Labs  Lab 11/22/21 0927 11/23/21 0803 11/24/21 0446 11/25/21 0448  NA 139 138 140 140  K 3.8 3.8 3.7 3.9  CL 107 110 108 107  CO2 26 23 25 26   GLUCOSE 107* 107* 98 95  BUN 13 9 10 16   CREATININE 0.71 0.67 0.68 0.71  CALCIUM 8.7* 8.8* 8.7* 9.2   GFR: Estimated Creatinine Clearance: 98.1 mL/min (by C-G formula based on SCr of 0.71 mg/dL). Liver Function Tests: Recent Labs  Lab 11/22/21 1205  AST 17  ALT 15  ALKPHOS 68  BILITOT 0.4  PROT 7.2  ALBUMIN 3.7   No results for input(s): LIPASE, AMYLASE in the last 168 hours. No results for input(s): AMMONIA in the last 168 hours. Coagulation Profile: Recent Labs  Lab 11/22/21 1205  INR 1.1   Cardiac Enzymes: No results for input(s): CKTOTAL, CKMB, CKMBINDEX, TROPONINI in the last 168 hours. BNP (last 3 results) No results for input(s): PROBNP in the last 8760 hours. HbA1C: Recent Labs     11/22/21 1205 11/23/21 0506  HGBA1C 6.0* 5.9*   CBG: No results for input(s): GLUCAP in the last 168 hours. Lipid Profile: Recent Labs    11/22/21 1205  CHOL 152  HDL 57  LDLCALC 87  TRIG 39  CHOLHDL 2.7   Thyroid Function Tests: No results for input(s): TSH, T4TOTAL, FREET4, T3FREE, THYROIDAB in the last 72 hours. Anemia Panel: No results for input(s): VITAMINB12, FOLATE, FERRITIN, TIBC, IRON, RETICCTPCT in the last 72 hours. Sepsis Labs: No results for input(s): PROCALCITON, LATICACIDVEN in the last 168 hours.  Recent Results (from the past 240 hour(s))  Resp Panel by RT-PCR (Flu A&B, Covid) Nasopharyngeal Swab     Status: None   Collection Time: 11/22/21 12:05 PM   Specimen: Nasopharyngeal Swab; Nasopharyngeal(NP) swabs in vial  transport medium  Result Value Ref Range Status   SARS Coronavirus 2 by RT PCR NEGATIVE NEGATIVE Final    Comment: (NOTE) SARS-CoV-2 target nucleic acids are NOT DETECTED.  The SARS-CoV-2 RNA is generally detectable in upper respiratory specimens during the acute phase of infection. The lowest concentration of SARS-CoV-2 viral copies this assay can detect is 138 copies/mL. A negative result does not preclude SARS-Cov-2 infection and should not be used as the sole basis for treatment or other patient management decisions. A negative result may occur with  improper specimen collection/handling, submission of specimen other than nasopharyngeal swab, presence of viral mutation(s) within the areas targeted by this assay, and inadequate number of viral copies(<138 copies/mL). A negative result must be combined with clinical observations, patient history, and epidemiological information. The expected result is Negative.  Fact Sheet for Patients:  BloggerCourse.com  Fact Sheet for Healthcare Providers:  SeriousBroker.it  This test is no t yet approved or cleared by the [place] FDA and  has  been authorized for detection and/or diagnosis of SARS-CoV-2 by FDA under an Emergency Use Authorization (EUA). This EUA will remain  in effect (meaning this test can be used) for the duration of the COVID-19 declaration under Section 564(b)(1) of the Act, 21 U.S.C.section 360bbb-3(b)(1), unless the authorization is terminated  or revoked sooner.       Influenza A by PCR NEGATIVE NEGATIVE Final   Influenza B by PCR NEGATIVE NEGATIVE Final    Comment: (NOTE) The Xpert Xpress SARS-CoV-2/FLU/RSV plus assay is intended as an aid in the diagnosis of influenza from Nasopharyngeal swab specimens and should not be used as a sole basis for treatment. Nasal washings and aspirates are unacceptable for Xpert Xpress SARS-CoV-2/FLU/RSV testing.  Fact Sheet for Patients: BloggerCourse.com  Fact Sheet for Healthcare Providers: SeriousBroker.it  This test is not yet approved or cleared by the [place] FDA and has been authorized for detection and/or diagnosis of SARS-CoV-2 by FDA under an Emergency Use Authorization (EUA). This EUA will remain in effect (meaning this test can be used) for the duration of the COVID-19 declaration under Section 564(b)(1) of the Act, 21 U.S.C. section 360bbb-3(b)(1), unless the authorization is terminated or revoked.  Performed at [facility], [address], [place], [place] [address]          Radiology Studies: ECHOCARDIOGRAM COMPLETE  Result Date: 11/23/2021    ECHOCARDIOGRAM REPORT   Patient Name:   WILNA PENNIE Date of Exam: 11/23/2021 Medical Rec #:  [id number]    Height:       65.0 in Accession #:    [id number]   Weight:       264.0 lb Date of Birth:  07/31/1961    BSA:          2.226 m Patient Age:    60 years     BP:           153/76 mmHg Patient Gender: F            HR:           70 bpm. Exam Location:  [facility] Procedure: 2D Echo Indications:     Stroke  History:         Patient has no  prior history of Echocardiogram examinations.                  Risk Factors:Hypertension, Current Smoker and Morbidly Obese.  Sonographer:     L Thornton-Maynard Referring Phys:  [id number] MATTHEW M  ECKSTAT Diagnosing Phys: Julien Nordmann MD  Sonographer Comments: Suboptimal subcostal window. IMPRESSIONS  1. Left ventricular ejection fraction, by estimation, is 60 to 65%. The left ventricle has normal function. The left ventricle has no regional wall motion abnormalities. There is moderate left ventricular hypertrophy. Left ventricular diastolic parameters are consistent with Grade I diastolic dysfunction (impaired relaxation).  2. Right ventricular systolic function is normal. The right ventricular size is normal. Tricuspid regurgitation signal is inadequate for assessing PA pressure.  3. Left atrial size was mildly dilated.  4. A small pericardial effusion is present.  5. The mitral valve is normal in structure. Mild mitral valve regurgitation. No evidence of mitral stenosis.  6. The aortic valve is normal in structure. Aortic valve regurgitation is not visualized. No aortic stenosis is present.  7. The inferior vena cava is normal in size with greater than 50% respiratory variability, suggesting right atrial pressure of 3 mmHg. FINDINGS  Left Ventricle: Left ventricular ejection fraction, by estimation, is 60 to 65%. The left ventricle has normal function. The left ventricle has no regional wall motion abnormalities. The left ventricular internal cavity size was normal in size. There is  moderate left ventricular hypertrophy. Left ventricular diastolic parameters are consistent with Grade I diastolic dysfunction (impaired relaxation). Right Ventricle: The right ventricular size is normal. No increase in right ventricular wall thickness. Right ventricular systolic function is normal. Tricuspid regurgitation signal is inadequate for assessing PA pressure. Left Atrium: Left atrial size was mildly dilated. Right  Atrium: Right atrial size was normal in size. Pericardium: A small pericardial effusion is present. Mitral Valve: The mitral valve is normal in structure. Mild mitral valve regurgitation. No evidence of mitral valve stenosis. Tricuspid Valve: The tricuspid valve is normal in structure. Tricuspid valve regurgitation is not demonstrated. No evidence of tricuspid stenosis. Aortic Valve: The aortic valve is normal in structure. Aortic valve regurgitation is not visualized. No aortic stenosis is present. Aortic valve peak gradient measures 6.1 mmHg. Pulmonic Valve: The pulmonic valve was normal in structure. Pulmonic valve regurgitation is not visualized. No evidence of pulmonic stenosis. Aorta: The aortic root is normal in size and structure. Venous: The inferior vena cava is normal in size with greater than 50% respiratory variability, suggesting right atrial pressure of 3 mmHg. IAS/Shunts: No atrial level shunt detected by color flow Doppler.  LEFT VENTRICLE PLAX 2D LVIDd:         4.90 cm   Diastology LVIDs:         3.00 cm   LV e' medial:    5.22 cm/s LV PW:         1.40 cm   LV E/e' medial:  14.4 LV IVS:        1.80 cm   LV e' lateral:   5.33 cm/s LVOT diam:     2.00 cm   LV E/e' lateral: 14.1 LV SV:         58 LV SV Index:   26 LVOT Area:     3.14 cm  RIGHT VENTRICLE RV S prime:     16.00 cm/s TAPSE (M-mode): 1.8 cm LEFT ATRIUM             Index LA diam:        4.30 cm 1.93 cm/m LA Vol (A2C):   69.1 ml 31.04 ml/m LA Vol (A4C):   84.7 ml 38.05 ml/m LA Biplane Vol: 76.9 ml 34.55 ml/m  AORTIC VALVE  PULMONIC VALVE AV Area (Vmax): 1.82 cm     PV Vmax:          1.14 m/s AV Vmax:        123.00 cm/s  PV Peak grad:     5.2 mmHg AV Peak Grad:   6.1 mmHg     PR End Diast Vel: 1.43 msec LVOT Vmax:      71.10 cm/s LVOT Vmean:     57.100 cm/s LVOT VTI:       0.185 m  AORTA Ao Root diam: 3.20 cm Ao Asc diam:  3.30 cm MITRAL VALVE MV Area (PHT): 2.86 cm    SHUNTS MV E velocity: 75.00 cm/s  Systemic VTI:   0.18 m MV A velocity: 90.80 cm/s  Systemic Diam: 2.00 cm MV E/A ratio:  0.83 Julien Nordmann MD Electronically signed by Julien Nordmann MD Signature Date/Time: 11/23/2021/2:00:01 PM    Final         Scheduled Meds:  amLODipine  10 mg Oral Daily   aspirin EC  81 mg Oral Daily   atorvastatin  80 mg Oral Daily   clopidogrel  75 mg Oral Daily   darifenacin  15 mg Oral Daily   DULoxetine  30 mg Oral Daily   enoxaparin (LOVENOX) injection  40 mg Subcutaneous Q24H   gabapentin  300 mg Oral QPM   hydrochlorothiazide  12.5 mg Oral Daily   lamoTRIgine  100 mg Oral QHS   propranolol  10 mg Oral BID   traZODone  50 mg Oral QHS   Continuous Infusions:   LOS: 3 days    Time spent: 15 mins    Charise Killian, MD [facility] Pager [phone]  If 7PM-7AM, please contact night-coverage 11/25/2021, 7:32 AM

## 2021-11-25 NOTE — Progress Notes (Signed)
Physical Therapy Treatment Patient Details Name: Kristin Burgess MRN: [id number] DOB: 06-18-1961 Today's Date: 11/25/2021   History of Present Illness 60 y.o. female with history of hypertension, tobacco use, seizures, AVM, major depression, morbid obesity, who presents with right leg weakness. MRI indicates scattered small acute infarcts in the left frontoparietal lobes much greater than right frontal lobe and chronic right parietal infarct.    PT Comments    Pt received in chair requesting to use commode. ModA to raise from recliner to RW.  Pt completed gait training to and from commode with RW, ModA for balance and blocking of right knee to prevent buckling. Pt required significant assist to maintain sequencing and prevent fall.  Overall good tolerance for mobility despite limited space and first time ambulating with walker. Pt continues to progress functionally. R LE remains grossly 2+/3- throughout. Pt anxiously awaiting CIR placement.    Recommendations for follow up therapy are one component of a multi-disciplinary discharge planning process, led by the attending physician.  Recommendations may be updated based on patient status, additional functional criteria and insurance authorization.  Follow Up Recommendations  Acute inpatient rehab (3hours/day)     Assistance Recommended at Discharge Frequent or constant Supervision/Assistance  Equipment Recommendations       Recommendations for Other Services       Precautions / Restrictions Precautions Precautions: Fall Restrictions Weight Bearing Restrictions: No     Mobility  Bed Mobility               General bed mobility comments:  (not assessed, pt up in chair upon arrival)    Transfers Overall transfer level: Needs assistance Equipment used: Rolling walker (2 wheels) Transfers: Sit to/from Stand Sit to Stand: Mod assist;Max assist           General transfer comment:  (MaxA needed to raise from commode, MaxA to  maintain balance while completing hygeine in standing.)    Ambulation/Gait Ambulation/Gait assistance: Min assist;Mod assist Gait Distance (Feet): 5 Feet Assistive device: Rolling walker (2 wheels) Gait Pattern/deviations: Step-to pattern;Trunk flexed;Ataxic       General Gait Details:  (Pt ambulated 42ft x 2 requiring moderate blocking of R knee to prevent buckling during weight acceptance.)   Stairs             Wheelchair Mobility    Modified Rankin (Stroke Patients Only)       Balance                                            Cognition Arousal/Alertness: Awake/alert Behavior During Therapy: WFL for tasks assessed/performed;Anxious;Impulsive Overall Cognitive Status: Within Functional Limits for tasks assessed                                          Exercises General Exercises - Lower Extremity Ankle Circles/Pumps: AAROM;Right;10 reps Long Arc Quad: Right;10 reps;AAROM Hip Flexion/Marching: AAROM;Right;10 reps Other Exercises Other Exercises: grossly 2+/3- R LE strength    General Comments General comments (skin integrity, edema, etc.): Pt educated on hand placement during transfers and proper sequencing during gait with RW      Pertinent Vitals/Pain Pain Assessment: No/denies pain    Home Living  Prior Function            PT Goals (current goals can now be found in the care plan section) Acute Rehab PT Goals Patient Stated Goal: to go home    Frequency    7X/week      PT Plan Current plan remains appropriate    Co-evaluation              AM-PAC PT "6 Clicks" Mobility   Outcome Measure  Help needed turning from your back to your side while in a flat bed without using bedrails?: A Little Help needed moving from lying on your back to sitting on the side of a flat bed without using bedrails?: A Little Help needed moving to and from a bed to a chair (including a  wheelchair)?: A Lot Help needed standing up from a chair using your arms (e.g., wheelchair or bedside chair)?: A Lot Help needed to walk in hospital room?: A Lot Help needed climbing 3-5 steps with a railing? : Total 6 Click Score: 13    End of Session Equipment Utilized During Treatment: Gait belt Activity Tolerance: Patient tolerated treatment well Patient left: in chair;with call bell/phone within reach;with chair alarm set Nurse Communication: Mobility status;Precautions PT Visit Diagnosis: Unsteadiness on feet (R26.81);Other abnormalities of gait and mobility (R26.89);Muscle weakness (generalized) (M62.81);History of falling (Z91.81);Difficulty in walking, not elsewhere classified (R26.2);Other symptoms and signs involving the nervous system (R29.898);Hemiplegia and hemiparesis Hemiplegia - Right/Left: Right Hemiplegia - dominant/non-dominant: Dominant Hemiplegia - caused by: Cerebral infarction     Time: 1610-9604 PT Time Calculation (min) (ACUTE ONLY): 23 min  Charges:  $Gait Training: 8-22 mins $Therapeutic Activity: 8-22 mins                    Zadie Cleverly, PTA    Jannet Askew 11/25/2021, 3:00 PM

## 2021-11-25 NOTE — TOC Progression Note (Signed)
Transition of Care [facility]) - Progression Note    Patient Details  Name: Kristin Burgess MRN: [id number] Date of Birth: [birth year]  Transition [facility] CM/SW Contact  Caryn Section, RN Phone Number: 11/25/2021, 4:30 PM  Clinical Narrative:   AS per Vernona Rieger at [facility], possible transfer tomorrow    Expected Discharge Plan:  (refuses SNF) Barriers to Discharge: Continued Medical Work up  Expected Discharge Plan and Services Expected Discharge Plan:  (refuses SNF)   Discharge Planning Services: CM Consult Post Acute Care Choice: Home Health Living arrangements for the past 2 months: Single Family Home                                       Social Determinants of Health (SDOH) Interventions    Readmission Risk Interventions No flowsheet data found.

## 2021-11-25 NOTE — Progress Notes (Signed)
Occupational Therapy Treatment Patient Details Name: Kristin Burgess MRN: [id number] DOB: 09/19/1961 Today's Date: 11/25/2021   History of present illness 60 y.o. female with history of hypertension, tobacco use, seizures, AVM, major depression, morbid obesity, who presents with right leg weakness. MRI indicates scattered small acute infarcts in the left frontoparietal lobes much greater than right frontal lobe and chronic right parietal infarct.   OT comments  [name] seen for OT treatment on this date. Upon arrival to room pt awake/alert. Pt seated in bed. Pt agreeable to tx. Pt SBA  for seated bathing, upper body and lower body~ 10 min. MAX A + 2 to stand, pt able to access LB for perihygiene, standing, ~1 min before self initiating return to sit. Pt CGA + VCs  for don/doff socks and underwear, seated in bed, VCs for technique and safety MAX A for standing portion to complete donning underwear. Pt MAX A + 2 + RW +VCs for functional mobility, bed to chair, ambulating, ~ 10 ft. Pt instructed in adaptive lower body dressing, hand positioning and safe DME use .Pt w/ poor carryover of instruction provided. Poor insight into safety deficits. Pt making good progress toward goals. Pt continues to benefit from skilled OT services to maximize return to PLOF and minimize risk of future falls, injury, caregiver burden, and readmission. Will continue to follow POC. Pt remains excellent candidate for AIR.     Recommendations for follow up therapy are one component of a multi-disciplinary discharge planning process, led by the attending physician.  Recommendations may be updated based on patient status, additional functional criteria and insurance authorization.    Follow Up Recommendations  Acute inpatient rehab (3hours/day)    Assistance Recommended at Discharge Frequent or constant Supervision/Assistance  Equipment Recommendations  Other (comment) (defer to next venue of care)    Recommendations for Other  Services      Precautions / Restrictions Precautions Precautions: Fall Restrictions Weight Bearing Restrictions: No       Mobility Bed Mobility Overal bed mobility: Needs Assistance Bed Mobility: Supine to Sit;Sit to Supine     Supine to sit: Min assist;HOB elevated Sit to supine: Min assist        Transfers Overall transfer level: Needs assistance Equipment used: Rolling walker (2 wheels) Transfers: Sit to/from Stand Sit to Stand: Mod assist;+2 physical assistance           General transfer comment: Pt varies in assist level needed     Balance Overall balance assessment: Needs assistance Sitting-balance support: No upper extremity supported;Feet supported Sitting balance-Leahy Scale: Good     Standing balance support: Bilateral upper extremity supported;During functional activity;Reliant on assistive device for balance Standing balance-Leahy Scale: Poor                             ADL either performed or assessed with clinical judgement   ADL Overall ADL's : Needs assistance/impaired                                       General ADL Comments: Pt SBA  for seated bathing, upper body and lower body~ 10 min. MAX A + 2 to stand, pt able to access LB for perihygiene, standing, ~1 min before self initiating return to sit. Pt CGA + VCs  for don/doff socks and underwear, seated in bed, VCs for technique and saftey.  Pt MAX A + 2 + RW +VCs for functional mobility, bed to chair, ambulating, ~ 20 ft.    Extremity/Trunk Assessment              Vision       Perception     Praxis      Cognition Arousal/Alertness: Awake/alert Behavior During Therapy: WFL for tasks assessed/performed;Anxious;Impulsive Overall Cognitive Status: Within Functional Limits for tasks assessed                                            Exercises Exercises: Other exercises Other Exercises Other Exercises: Pt educ re: falls prevention,  DME use, d/c recs, adaptive dressing techniques Other Exercises: Sup<>sit, sit<>stand x2 + 4 lateral steps along EOB, bathing, perihygiene, LB dressing- socks and underwear, functional mobility - bed to chair   Shoulder Instructions       General Comments      Pertinent Vitals/ Pain       Pain Assessment: No/denies pain  Home Living                                          Prior Functioning/Environment              Frequency  Min 5X/week        Progress Toward Goals  OT Goals(current goals can now be found in the care plan section)  Progress towards OT goals: Progressing toward goals  Acute Rehab OT Goals Patient Stated Goal: to go home OT Goal Formulation: With patient Time For Goal Achievement: 12/07/21 Potential to Achieve Goals: Good ADL Goals Pt Will Transfer to Toilet: with mod assist;with +2 assist;stand pivot transfer;bedside commode Pt Will Perform Toileting - Clothing Manipulation and hygiene: with mod assist;with 2+ total assist;sit to/from stand Pt/caregiver will Perform Home Exercise Program: Increased strength;Left upper extremity;Independently;With written HEP provided  Plan Discharge plan remains appropriate;Frequency remains appropriate    Co-evaluation                 AM-PAC OT "6 Clicks" Daily Activity     Outcome Measure   Help from another person eating meals?: None Help from another person taking care of personal grooming?: A Little Help from another person toileting, which includes using toliet, bedpan, or urinal?: A Lot Help from another person bathing (including washing, rinsing, drying)?: A Lot Help from another person to put on and taking off regular upper body clothing?: A Little Help from another person to put on and taking off regular lower body clothing?: A Little 6 Click Score: 17    End of Session Equipment Utilized During Treatment: Gait belt;Rolling walker (2 wheels)  OT Visit Diagnosis:  Unsteadiness on feet (R26.81);Muscle weakness (generalized) (M62.81)   Activity Tolerance Patient tolerated treatment well   Patient Left in chair;with call bell/phone within reach;with chair alarm set   Nurse Communication          Time: [phone] OT Time Calculation (min): 35 min  Charges: OT General Charges $OT Visit: 1 Visit OT Treatments $Self Care/Home Management : 23-37 mins  [place] [facility], Adine Madura 11/25/2021, 10:38 AM

## 2021-11-25 NOTE — Plan of Care (Signed)
Neurology plan of care  Patient amenable to CIR, they will contact patient. TTE w/ no e/o intracardiac clot.   Updated recommendations: 1) ASA 81mg  daily + plavix 75mg  daily x21 days f/b ASA 81mg  daily monotherapy after that 2) Atorvastatin 80mg  qhs 3) PT/OT/SLP 4) cautiously lower BP over next few days to goal of normotension; strict avoidance of hypotension 5) I will arrange outpatient f/u with stroke clinic  No further neurologic workup required as inpatient. Neurology will not continue to actively follow, but please re-engage if additional questions arise.  , MD [facility] [phone]  If 7pm- 7am, please page neurology on call as listed in [facility].

## 2021-11-26 ENCOUNTER — Other Ambulatory Visit: Payer: Self-pay

## 2021-11-26 ENCOUNTER — Inpatient Hospital Stay (HOSPITAL_COMMUNITY)
Admission: RE | Admit: 2021-11-26 | Discharge: 2021-12-05 | DRG: 057 | Disposition: A | Discharge status: Home / Self Care | Payer: Medicare Other | Source: Other Acute Inpatient Hospital | Attending: Physical Medicine & Rehabilitation | Admitting: Physical Medicine & Rehabilitation

## 2021-11-26 ENCOUNTER — Encounter (HOSPITAL_COMMUNITY): Payer: Self-pay | Admitting: Physical Medicine & Rehabilitation

## 2021-11-26 DIAGNOSIS — F339 Major depressive disorder, recurrent, unspecified: Secondary | ICD-10-CM | POA: Diagnosis not present

## 2021-11-26 DIAGNOSIS — G4700 Insomnia, unspecified: Secondary | ICD-10-CM | POA: Diagnosis present

## 2021-11-26 DIAGNOSIS — F32A Depression, unspecified: Secondary | ICD-10-CM | POA: Diagnosis present

## 2021-11-26 DIAGNOSIS — K5900 Constipation, unspecified: Secondary | ICD-10-CM | POA: Diagnosis present

## 2021-11-26 DIAGNOSIS — Z8774 Personal history of (corrected) congenital malformations of heart and circulatory system: Secondary | ICD-10-CM | POA: Diagnosis not present

## 2021-11-26 DIAGNOSIS — I10 Essential (primary) hypertension: Secondary | ICD-10-CM | POA: Diagnosis present

## 2021-11-26 DIAGNOSIS — F17210 Nicotine dependence, cigarettes, uncomplicated: Secondary | ICD-10-CM | POA: Diagnosis present

## 2021-11-26 DIAGNOSIS — I6389 Other cerebral infarction: Secondary | ICD-10-CM | POA: Diagnosis not present

## 2021-11-26 DIAGNOSIS — E785 Hyperlipidemia, unspecified: Secondary | ICD-10-CM | POA: Diagnosis present

## 2021-11-26 DIAGNOSIS — I69351 Hemiplegia and hemiparesis following cerebral infarction affecting right dominant side: Secondary | ICD-10-CM | POA: Diagnosis present

## 2021-11-26 DIAGNOSIS — Z6841 Body Mass Index (BMI) 40.0 and over, adult: Secondary | ICD-10-CM

## 2021-11-26 DIAGNOSIS — Z800 Family history of malignant neoplasm of digestive organs: Secondary | ICD-10-CM | POA: Diagnosis not present

## 2021-11-26 DIAGNOSIS — Z79899 Other long term (current) drug therapy: Secondary | ICD-10-CM | POA: Diagnosis not present

## 2021-11-26 DIAGNOSIS — G40909 Epilepsy, unspecified, not intractable, without status epilepticus: Secondary | ICD-10-CM | POA: Diagnosis present

## 2021-11-26 DIAGNOSIS — Z801 Family history of malignant neoplasm of trachea, bronchus and lung: Secondary | ICD-10-CM | POA: Diagnosis not present

## 2021-11-26 DIAGNOSIS — Z720 Tobacco use: Secondary | ICD-10-CM

## 2021-11-26 DIAGNOSIS — I639 Cerebral infarction, unspecified: Secondary | ICD-10-CM | POA: Diagnosis not present

## 2021-11-26 DIAGNOSIS — F419 Anxiety disorder, unspecified: Secondary | ICD-10-CM | POA: Diagnosis present

## 2021-11-26 LAB — CBC
HCT: 42.2 % (ref 36.0–46.0)
HCT: 43.5 % (ref 36.0–46.0)
Hemoglobin: 12.8 g/dL (ref 12.0–15.0)
Hemoglobin: 13.4 g/dL (ref 12.0–15.0)
MCH: 20.8 pg — ABNORMAL LOW (ref 26.0–34.0)
MCH: 21.2 pg — ABNORMAL LOW (ref 26.0–34.0)
MCHC: 30.3 g/dL (ref 30.0–36.0)
MCHC: 30.8 g/dL (ref 30.0–36.0)
MCV: 68.5 fL — ABNORMAL LOW (ref 80.0–100.0)
MCV: 68.7 fL — ABNORMAL LOW (ref 80.0–100.0)
Platelets: 238 10*3/uL (ref 150–400)
Platelets: 263 10*3/uL (ref 150–400)
RBC: 6.16 MIL/uL — ABNORMAL HIGH (ref 3.87–5.11)
RBC: 6.33 MIL/uL — ABNORMAL HIGH (ref 3.87–5.11)
RDW: 16.8 % — ABNORMAL HIGH (ref 11.5–15.5)
RDW: 16.4 % — ABNORMAL HIGH (ref 11.5–15.5)
WBC: 3.6 10*3/uL — ABNORMAL LOW (ref 4.0–10.5)
WBC: 4.3 10*3/uL (ref 4.0–10.5)
nRBC: 0 % (ref 0.0–0.2)
nRBC: 0 % (ref 0.0–0.2)

## 2021-11-26 LAB — BASIC METABOLIC PANEL
Anion gap: 9 (ref 5–15)
BUN: 21 mg/dL — ABNORMAL HIGH (ref 6–20)
CO2: 25 mmol/L (ref 22–32)
Calcium: 9.2 mg/dL (ref 8.9–10.3)
Chloride: 105 mmol/L (ref 98–111)
Creatinine, Ser: 0.73 mg/dL (ref 0.44–1.00)
GFR, Estimated: >60 mL/min (ref >60)
Glucose, Bld: 96 mg/dL (ref 70–99)
Potassium: 3.9 mmol/L (ref 3.5–5.1)
Sodium: 139 mmol/L (ref 135–145)

## 2021-11-26 LAB — CREATININE, SERUM
Creatinine, Ser: 0.9 mg/dL (ref 0.44–1.00)
GFR, Estimated: >60 mL/min (ref >60)

## 2021-11-26 MED ORDER — HYDROCHLOROTHIAZIDE 12.5 MG PO TABS
12.5000 mg | ORAL_TABLET | Freq: Every day | ORAL | Status: DC
  Administered 2021-11-26 – 2021-12-05 (×10): 12.5 mg via ORAL
  Filled 2021-11-26 (×10): qty 1

## 2021-11-26 MED ORDER — CLOPIDOGREL BISULFATE 75 MG PO TABS
75.0000 mg | ORAL_TABLET | Freq: Every day | ORAL | Status: DC
  Administered 2021-11-27 – 2021-12-05 (×9): 75 mg via ORAL
  Filled 2021-11-26 (×9): qty 1

## 2021-11-26 MED ORDER — ATORVASTATIN CALCIUM 80 MG PO TABS
80.0000 mg | ORAL_TABLET | Freq: Every day | ORAL | Status: DC
  Administered 2021-11-27 – 2021-12-05 (×9): 80 mg via ORAL
  Filled 2021-11-26 (×9): qty 1

## 2021-11-26 MED ORDER — ATORVASTATIN CALCIUM 80 MG PO TABS: 80.0000 mg | ORAL_TABLET | Freq: Every day | ORAL | Status: DC

## 2021-11-26 MED ORDER — GABAPENTIN 300 MG PO CAPS
300.0000 mg | ORAL_CAPSULE | Freq: Every evening | ORAL | Status: DC
  Administered 2021-11-26 – 2021-11-29 (×4): 300 mg via ORAL
  Filled 2021-11-26 (×4): qty 1

## 2021-11-26 MED ORDER — TRAZODONE HCL 50 MG PO TABS
50.0000 mg | ORAL_TABLET | Freq: Every day | ORAL | Status: DC
  Administered 2021-11-26 – 2021-12-04 (×9): 50 mg via ORAL
  Filled 2021-11-26 (×9): qty 1

## 2021-11-26 MED ORDER — DARIFENACIN HYDROBROMIDE ER 15 MG PO TB24
15.0000 mg | ORAL_TABLET | Freq: Every day | ORAL | Status: DC
  Administered 2021-11-27 – 2021-12-05 (×9): 15 mg via ORAL
  Filled 2021-11-26 (×9): qty 1

## 2021-11-26 MED ORDER — AMLODIPINE BESYLATE 10 MG PO TABS
10.0000 mg | ORAL_TABLET | Freq: Every day | ORAL | Status: DC
  Administered 2021-11-26 – 2021-12-05 (×10): 10 mg via ORAL
  Filled 2021-11-26 (×10): qty 1

## 2021-11-26 MED ORDER — DOCUSATE SODIUM 100 MG PO CAPS
200.0000 mg | ORAL_CAPSULE | Freq: Two times a day (BID) | ORAL | Status: DC
  Administered 2021-11-26 – 2021-11-27 (×2): 200 mg via ORAL
  Filled 2021-11-26 (×3): qty 2

## 2021-11-26 MED ORDER — ACETAMINOPHEN 325 MG PO TABS
650.0000 mg | ORAL_TABLET | ORAL | Status: DC | PRN
  Administered 2021-11-30 – 2021-12-05 (×4): 650 mg via ORAL
  Filled 2021-11-26 (×6): qty 2

## 2021-11-26 MED ORDER — DULOXETINE HCL 30 MG PO CPEP
30.0000 mg | ORAL_CAPSULE | Freq: Every day | ORAL | Status: DC
  Administered 2021-11-27 – 2021-12-05 (×9): 30 mg via ORAL
  Filled 2021-11-26 (×9): qty 1

## 2021-11-26 MED ORDER — LAMOTRIGINE ER 100 MG PO TB24
100.0000 mg | ORAL_TABLET | Freq: Every day | ORAL | Status: DC
  Administered 2021-11-26 – 2021-11-28 (×3): 100 mg via ORAL
  Filled 2021-11-26 (×5): qty 1

## 2021-11-26 MED ORDER — PROPRANOLOL HCL 10 MG PO TABS
10.0000 mg | ORAL_TABLET | Freq: Two times a day (BID) | ORAL | Status: DC
  Administered 2021-11-26 – 2021-12-05 (×18): 10 mg via ORAL
  Filled 2021-11-26 (×18): qty 1

## 2021-11-26 MED ORDER — ACETAMINOPHEN 650 MG RE SUPP: 650.0000 mg | RECTAL | Status: DC | PRN

## 2021-11-26 MED ORDER — ENOXAPARIN SODIUM 40 MG/0.4ML IJ SOSY: 40.0000 mg | PREFILLED_SYRINGE | INTRAMUSCULAR | Status: DC

## 2021-11-26 MED ORDER — MAGNESIUM GLUCONATE 500 MG PO TABS
250.0000 mg | ORAL_TABLET | Freq: Every day | ORAL | Status: DC
  Administered 2021-11-26 – 2021-12-04 (×9): 250 mg via ORAL
  Filled 2021-11-26 (×9): qty 1

## 2021-11-26 MED ORDER — ENOXAPARIN SODIUM 40 MG/0.4ML IJ SOSY
40.0000 mg | PREFILLED_SYRINGE | INTRAMUSCULAR | Status: DC
  Administered 2021-11-26 – 2021-12-04 (×9): 40 mg via SUBCUTANEOUS
  Filled 2021-11-26 (×9): qty 0.4

## 2021-11-26 MED ORDER — CLOPIDOGREL BISULFATE 75 MG PO TABS
75.0000 mg | ORAL_TABLET | Freq: Every day | ORAL | Status: DC
Start: 2021-11-27 — End: 2021-12-04

## 2021-11-26 MED ORDER — ASPIRIN EC 81 MG PO TBEC
81.0000 mg | DELAYED_RELEASE_TABLET | Freq: Every day | ORAL | Status: DC
  Administered 2021-11-27 – 2021-12-05 (×9): 81 mg via ORAL
  Filled 2021-11-26 (×9): qty 1

## 2021-11-26 MED ORDER — ACETAMINOPHEN 160 MG/5ML PO SOLN: 650.0000 mg | ORAL | Status: DC | PRN

## 2021-11-26 MED ORDER — ASPIRIN 81 MG PO TBEC: 81.0000 mg | DELAYED_RELEASE_TABLET | Freq: Every day | ORAL | 11 refills | Status: AC

## 2021-11-26 MED ORDER — ACETAMINOPHEN 325 MG PO TABS: 650.0000 mg | ORAL_TABLET | ORAL | Status: AC | PRN

## 2021-11-26 MED ORDER — ALPRAZOLAM 0.25 MG PO TABS
0.2500 mg | ORAL_TABLET | Freq: Two times a day (BID) | ORAL | Status: DC | PRN
  Administered 2021-11-27 – 2021-12-05 (×15): 0.25 mg via ORAL
  Filled 2021-11-26 (×16): qty 1

## 2021-11-26 NOTE — Care Management Important Message (Signed)
Important Message  Patient Details  Name: Kristin Burgess MRN: [id number] Date of Birth: May 29, 1961   Medicare Important Message Given:  Yes     Olegario Messier A Rashon Rezek 11/26/2021, 10:29 AM

## 2021-11-26 NOTE — Progress Notes (Signed)
Inpatient Rehab Admissions Coordinator:  ? ?I have a bed for this Pt. On CIR today. RN may call report to [phone]. ? ?Evann Erazo, MS, CCC-SLP ?Rehab Admissions Coordinator  ?[phone] (celll) ?[phone] (office) ?

## 2021-11-26 NOTE — Discharge Summary (Signed)
Physician Discharge Summary  Kristin Burgess ONG:[id number] DOB: Jun 27, 1961 DOA: 11/22/2021  PCP: Serita Grit, MD  Admit date: 11/22/2021 Discharge date: 11/26/2021  Admitted From: Home Disposition: Skilled nursing facility  Recommendations for Outpatient Follow-up:  Follow up with PCP within 1-2 weeks Follow up with neurology in 2-3 weeks   Discharge Condition:improved, stable CODE STATUS:  Code Status: Full Code  Regular healthy diet  Brief/Interim Summary: Pt presented with subacute stroke and residual right leg weakness. Neurology evaluated and recommended ASA + plavix for 21 days with atorvastatin  and gradual normalization of blood pressure. She endorsed improvement in her leg weakness and was able to work with PT. She will be discharged to [facility] for further rehab and follow up with stroke clinic outpatient. On day of discharge, her blood pressure had been normalized and her other chronic conditions were stable on home therapy.   Discharge Diagnoses:  Principal Problem:   Leg weakness Active Problems:   Essential hypertension   H/O arteriovenous malformation (AVM)   History of seizures   Major depressive disorder, recurrent episode with mixed features (HCC)   Morbid obesity (HCC)   Tobacco abuse   Cerebrovascular accident (CVA) [facility])   Discharge Instructions     Ambulatory referral to Neurology   Complete by: As directed    An appointment is requested in approximately: 6 wks      Allergies as of 11/26/2021       Reactions   Latex Itching        Medication List     TAKE these medications    acetaminophen 325 MG tablet Commonly known as: TYLENOL Take 2 tablets (650 mg total) by mouth every 4 (four) hours as needed for mild pain (or temp > 37.5 C (99.5 F)).   amLODipine 10 MG tablet Commonly known as: NORVASC Take 10 mg by mouth daily.   aspirin 81 MG EC tablet Take 1 tablet (81 mg total) by mouth daily. Swallow whole. Start taking on:  November 27, 2021   atorvastatin 80 MG tablet Commonly known as: LIPITOR Take 1 tablet (80 mg total) by mouth daily. Start taking on: November 27, 2021 What changed:  medication strength how much to take   clopidogrel 75 MG tablet Commonly known as: PLAVIX Take 1 tablet (75 mg total) by mouth daily. Start taking on: November 27, 2021   DULoxetine 30 MG capsule Commonly known as: CYMBALTA Take 30 mg by mouth daily.   gabapentin 300 MG capsule Commonly known as: NEURONTIN Take 300 mg by mouth every evening.   hydrochlorothiazide 12.5 MG tablet Commonly known as: HYDRODIURIL Take 12.5 mg by mouth daily.   lamoTRIgine 100 MG 24 hour tablet Commonly known as: LAMICTAL XR Take 100 mg by mouth daily.   propranolol 10 MG tablet Commonly known as: INDERAL Take 10 mg by mouth 2 (two) times daily.   solifenacin 10 MG tablet Commonly known as: VESICARE Take 10 mg by mouth daily.   traZODone 50 MG tablet Commonly known as: DESYREL Take 50 mg by mouth at bedtime.        Allergies  Allergen Reactions   Latex Itching    Consultations: neurology  Procedures/Studies: CT ANGIO HEAD NECK W WO CM  Result Date: 11/22/2021 CLINICAL DATA:  CVA EXAM: CT ANGIOGRAPHY HEAD AND NECK TECHNIQUE: Multidetector CT imaging of the head and neck was performed using the standard protocol during bolus administration of intravenous contrast. Multiplanar CT image reconstructions and MIPs were obtained to evaluate the  vascular anatomy. Carotid stenosis measurements (when applicable) are obtained utilizing NASCET criteria, using the distal internal carotid diameter as the denominator. CONTRAST:  75mL OMNIPAQUE IOHEXOL 350 MG/ML SOLN COMPARISON:  February 2018 disease FINDINGS: CTA NECK Aortic arch: Great vessel origins are patent. Right carotid system: Patent. Primarily calcified plaque at the bifurcation and proximal internal carotid without stenosis. Left carotid system: Patent. Primarily calcified  plaque at the bifurcation and proximal internal carotid without stenosis. Vertebral arteries: Patent.  Codominant.  No stenosis. Skeleton: Cervical spine degenerative changes. Other neck: Unremarkable. Upper chest: No apical lung mass. Review of the MIP images confirms the above findings CTA HEAD Anterior circulation: Intracranial internal carotid arteries are patent with mild calcified plaque. There are two small (1-2 mm) inferiorly directed outpouchings from the distal supraclinoid right ICA (series 11, image 25). The more proximal may be contiguous with a small vessel. Anterior cerebral arteries are patent. Anterior communicating artery is present. New focal marked stenosis of the azygous left A2 ACA at the level of the genu of the corpus callosum (series 11, image 26). Middle cerebral arteries are patent. Posterior circulation: Intracranial vertebral arteries are patent with mild calcified plaque. Basilar artery is patent. Major cerebellar artery origins are patent. No evidence of recurrent AVM. Venous sinuses: Patent as allowed by contrast bolus timing. Review of the MIP images confirms the above findings IMPRESSION: No large vessel occlusion.  No significant stenosis in the neck. 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 No evidence of recurrent AVM. Two small inferiorly directed outpouchings from the distal supraclinoid right ICA reflecting infundibula or aneurysms. Electronically Signed   By: Guadlupe Spanish M.D.   On: 11/22/2021 12:50   DG Lumbar Spine Complete  Result Date: 11/22/2021 CLINICAL DATA:  Right leg weakness. EXAM: LUMBAR SPINE - COMPLETE 4+ VIEW COMPARISON:  CT 04/11/2021 FINDINGS: Alignment of the lumbar spine is within normal limits. The vertebral body heights and disc spaces are maintained. Multilevel degenerative endplate changes. Negative for fracture. Negative for a pars defect. IMPRESSION: No acute abnormality in  lumbar spine. Electronically Signed   By: Richarda Overlie M.D.   On: 11/22/2021 11:52   CT HEAD WO CONTRAST ( )  Result Date: 11/22/2021 CLINICAL DATA:  Weakness, rule out stroke EXAM: CT HEAD WITHOUT CONTRAST TECHNIQUE: Contiguous axial images were obtained from the base of the skull through the vertex without intravenous contrast. COMPARISON:  02/09/2017 FINDINGS: Brain: No definite evidence of acute infarction, hemorrhage, hydrocephalus, extra-axial collection or mass lesion/mass effect. Unchanged encephalomalacia of the right parietal vertex (series 2, image 26). There is new, subtle hypodensity of the deep white matter in the left parietal lobe (series 2, image 22). Vascular: No hyperdense vessel or unexpected calcification. Skull: Normal. Negative for fracture or focal lesion. Sinuses/Orbits: No acute finding. Other: None. IMPRESSION: 1. There is new, subtle hypodensity of the deep white matter in the left parietal lobe, consistent with infarction although age indeterminate, most likely subacute to chronic. Consider MRI to more sensitively evaluate for acute diffusion restricting infarction if suspected. 2. Unchanged nonacute encephalomalacia of the right parietal vertex. Electronically Signed   By: Jearld Lesch M.D.   On: 11/22/2021 11:24   MR BRAIN WO CONTRAST  Result Date: 11/22/2021 CLINICAL DATA:  Neuro deficit, acute, stroke suspected EXAM: MRI HEAD WITHOUT CONTRAST TECHNIQUE: Multiplanar, multiecho pulse sequences of the brain and surrounding structures were obtained without intravenous contrast. COMPARISON:  None. FINDINGS: Motion  artifact is present. Brain: Few scattered small foci of restricted diffusion are present in the parasagittal left frontoparietal lobes. Small focus also present within the right frontal lobe. Chronic infarct of the right parietal lobe with chronic blood products. Additional patchy T2 hyperintensity in the supratentorial white matter is nonspecific but may reflect  chronic microvascular ischemic changes. Ventricles and sulci are within normal limits in size and configuration. Vascular: Major vessel flow voids at the skull base are preserved. Skull and upper cervical spine: Normal marrow signal is preserved. Sinuses/Orbits: Paranasal sinuses are aerated. Orbits are unremarkable. Other: Sella is unremarkable.  Mastoid air cells are clear. IMPRESSION: Scattered small acute infarcts in the parasagittal left frontoparietal lobes much greater than right frontal lobe probably representing ACA and ACA/MCA watershed infarcts in the setting of azygous A2 ACA stenosis seen on CTA. Chronic right parietal infarct. Chronic microvascular ischemic changes. Electronically Signed   By: Guadlupe Spanish M.D.   On: 11/22/2021 15:51   ECHOCARDIOGRAM COMPLETE  Result Date: 11/23/2021    ECHOCARDIOGRAM REPORT   Patient Name:   Kristin Burgess Date of Exam: 11/23/2021 Medical Rec #:  [id number]    Height:       65.0 in Accession #:    [id number]   Weight:       264.0 lb Date of Birth:  Feb 18, 1961    BSA:          2.226 m Patient Age:    60 years     BP:           153/76 mmHg Patient Gender: F            HR:           70 bpm. Exam Location:  [facility] Procedure: 2D Echo Indications:     Stroke  History:         Patient has no prior history of Echocardiogram examinations.                  Risk Factors:Hypertension, Current Smoker and Morbidly Obese.  Sonographer:     L Thornton-Maynard Referring Phys:  [id number] Mary Sella ECKSTAT Diagnosing Phys: Julien Nordmann MD  Sonographer Comments: Suboptimal subcostal window. IMPRESSIONS  1. Left ventricular ejection fraction, by estimation, is 60 to 65%. The left ventricle has normal function. The left ventricle has no regional wall motion abnormalities. There is moderate left ventricular hypertrophy. Left ventricular diastolic parameters are consistent with Grade I diastolic dysfunction (impaired relaxation).  2. Right ventricular systolic function is normal. The right  ventricular size is normal. Tricuspid regurgitation signal is inadequate for assessing PA pressure.  3. Left atrial size was mildly dilated.  4. A small pericardial effusion is present.  5. The mitral valve is normal in structure. Mild mitral valve regurgitation. No evidence of mitral stenosis.  6. The aortic valve is normal in structure. Aortic valve regurgitation is not visualized. No aortic stenosis is present.  7. The inferior vena cava is normal in size with greater than 50% respiratory variability, suggesting right atrial pressure of 3 mmHg. FINDINGS  Left Ventricle: Left ventricular ejection fraction, by estimation, is 60 to 65%. The left ventricle has normal function. The left ventricle has no regional wall motion abnormalities. The left ventricular internal cavity size was normal in size. There is  moderate left ventricular hypertrophy. Left ventricular diastolic parameters are consistent with Grade I diastolic dysfunction (impaired relaxation). Right Ventricle: The right ventricular size is normal. No increase in right ventricular wall thickness. Right ventricular  systolic function is normal. Tricuspid regurgitation signal is inadequate for assessing PA pressure. Left Atrium: Left atrial size was mildly dilated. Right Atrium: Right atrial size was normal in size. Pericardium: A small pericardial effusion is present. Mitral Valve: The mitral valve is normal in structure. Mild mitral valve regurgitation. No evidence of mitral valve stenosis. Tricuspid Valve: The tricuspid valve is normal in structure. Tricuspid valve regurgitation is not demonstrated. No evidence of tricuspid stenosis. Aortic Valve: The aortic valve is normal in structure. Aortic valve regurgitation is not visualized. No aortic stenosis is present. Aortic valve peak gradient measures 6.1 mmHg. Pulmonic Valve: The pulmonic valve was normal in structure. Pulmonic valve regurgitation is not visualized. No evidence of pulmonic stenosis. Aorta:  The aortic root is normal in size and structure. Venous: The inferior vena cava is normal in size with greater than 50% respiratory variability, suggesting right atrial pressure of 3 mmHg. IAS/Shunts: No atrial level shunt detected by color flow Doppler.  LEFT VENTRICLE PLAX 2D LVIDd:         4.90 cm   Diastology LVIDs:         3.00 cm   LV e' medial:    5.22 cm/s LV PW:         1.40 cm   LV E/e' medial:  14.4 LV IVS:        1.80 cm   LV e' lateral:   5.33 cm/s LVOT diam:     2.00 cm   LV E/e' lateral: 14.1 LV SV:         58 LV SV Index:   26 LVOT Area:     3.14 cm  RIGHT VENTRICLE RV S prime:     16.00 cm/s TAPSE (M-mode): 1.8 cm LEFT ATRIUM             Index LA diam:        4.30 cm 1.93 cm/m LA Vol (A2C):   69.1 ml 31.04 ml/m LA Vol (A4C):   84.7 ml 38.05 ml/m LA Biplane Vol: 76.9 ml 34.55 ml/m  AORTIC VALVE                 PULMONIC VALVE AV Area (Vmax): 1.82 cm     PV Vmax:          1.14 m/s AV Vmax:        123.00 cm/s  PV Peak grad:     5.2 mmHg AV Peak Grad:   6.1 mmHg     PR End Diast Vel: 1.43 msec LVOT Vmax:      71.10 cm/s LVOT Vmean:     57.100 cm/s LVOT VTI:       0.185 m  AORTA Ao Root diam: 3.20 cm Ao Asc diam:  3.30 cm MITRAL VALVE MV Area (PHT): 2.86 cm    SHUNTS MV E velocity: 75.00 cm/s  Systemic VTI:  0.18 m MV A velocity: 90.80 cm/s  Systemic Diam: 2.00 cm MV E/A ratio:  0.83 Julien Nordmann MD Electronically signed by Julien Nordmann MD Signature Date/Time: 11/23/2021/2:00:01 PM    Final    DG Hip Unilat W or Wo Pelvis 2-3 Views Right  Result Date: 11/22/2021 CLINICAL DATA:  Right leg weakness. EXAM: DG HIP (WITH OR WITHOUT PELVIS) 2-3V RIGHT COMPARISON:  None. FINDINGS: Pelvic bony ring is intact. Symmetric appearance of the SI joints. Normal appearance of left hip. Right hip is located without a fracture. No significant joint space narrowing in the hips. IMPRESSION: No acute abnormality. Electronically  Signed   By: Richarda Overlie M.D.   On: 11/22/2021 11:50    Subjective: Patient feels  improved today. She is able to move her right leg in the bed and has no pain. She is looking forward to continuing to work with PT.   Discharge Exam: Vitals:   11/26/21 0932 11/26/21 0938  BP: (!) 117/58 (!) 111/53  Pulse: 60 60  Resp:    Temp:    SpO2:      General: Pt is alert, awake, not in acute distress Cardiovascular: RRR, S1/S2 +, no rubs, no gallops Respiratory: CTA bilaterally, no wheezing, no rhonchi Abdominal: Soft, NT, ND, bowel sounds + Extremities: no edema, no cyanosis. R leg moving spontaneously in bed. Unable to lift against gravity. Normal sensation.   Labs: Basic Metabolic Panel: Recent Labs  Lab 11/22/21 0927 11/23/21 0803 11/24/21 0446 11/25/21 0448 11/26/21 0341  NA 139 138 140 140 139  K 3.8 3.8 3.7 3.9 3.9  CL 107 110 108 107 105  CO2 26 23 25 26 25   GLUCOSE 107* 107* 98 95 96  BUN 13 9 10 16  21*  CREATININE 0.71 0.67 0.68 0.71 0.73  CALCIUM 8.7* 8.8* 8.7* 9.2 9.2   CBC: Recent Labs  Lab 11/22/21 0927 11/23/21 0803 11/24/21 0446 11/25/21 0448 11/26/21 0341  WBC 5.1 3.9* 4.6 4.9 3.6*  HGB 12.9 12.6 12.2 12.3 12.8  HCT 42.4 41.5 39.9 40.0 42.2  MCV 70.0* 69.3* 67.7* 68.6* 68.5*  PLT 241 223 220 225 238    Microbiology Recent Results (from the past 240 hour(s))  Resp Panel by RT-PCR (Flu A&B, Covid) Nasopharyngeal Swab     Status: None   Collection Time: 11/22/21 12:05 PM   Specimen: Nasopharyngeal Swab; Nasopharyngeal(NP) swabs in vial transport medium  Result Value Ref Range Status   SARS Coronavirus 2 by RT PCR NEGATIVE NEGATIVE Final    Comment: (NOTE) SARS-CoV-2 target nucleic acids are NOT DETECTED.  The SARS-CoV-2 RNA is generally detectable in upper respiratory specimens during the acute phase of infection. The lowest concentration of SARS-CoV-2 viral copies this assay can detect is 138 copies/mL. A negative result does not preclude SARS-Cov-2 infection and should not be used as the sole basis for treatment or other  patient management decisions. A negative result may occur with  improper specimen collection/handling, submission of specimen other than nasopharyngeal swab, presence of viral mutation(s) within the areas targeted by this assay, and inadequate number of viral copies(<138 copies/mL). A negative result must be combined with clinical observations, patient history, and epidemiological information. The expected result is Negative.  Fact Sheet for Patients:  14/07/22  Fact Sheet for Healthcare Providers:  14/03/22  This test is no t yet approved or cleared by the [employer] FDA and  has been authorized for detection and/or diagnosis of SARS-CoV-2 by FDA under an Emergency Use Authorization (EUA). This EUA will remain  in effect (meaning this test can be used) for the duration of the COVID-19 declaration under Section 564(b)(1) of the Act, 21 U.S.C.section 360bbb-3(b)(1), unless the authorization is terminated  or revoked sooner.       Influenza A by PCR NEGATIVE NEGATIVE Final   Influenza B by PCR NEGATIVE NEGATIVE Final    Comment: (NOTE) The Xpert Xpress SARS-CoV-2/FLU/RSV plus assay is intended as an aid in the diagnosis of influenza from Nasopharyngeal swab specimens and should not be used as a sole basis for treatment. Nasal washings and aspirates are unacceptable for Xpert Xpress SARS-CoV-2/FLU/RSV testing.  Fact Sheet for Patients: [employer]  Fact Sheet for Healthcare Providers: [employer]  This test is not yet approved or cleared by the [place] FDA and has been authorized for detection and/or diagnosis of SARS-CoV-2 by FDA under an Emergency Use Authorization (EUA). This EUA will remain in effect (meaning this test can be used) for the duration of the COVID-19 declaration under Section 564(b)(1) of the Act, 21 U.S.C. section  360bbb-3(b)(1), unless the authorization is terminated or revoked.  Performed at [facility], [address] [place], [place] [address]     Time coordinating discharge: Over 30 minutes  Leeroy Bock, MD  [facility] 11/26/2021, 9:53 AM Pager   If 7PM-7AM, please contact night-coverage [email or web address] Password [email or web address]

## 2021-11-26 NOTE — Progress Notes (Signed)
Inpatient Rehabilitation Admission Medication Review by a Pharmacist  A complete drug regimen review was completed for this patient to identify any potential clinically significant medication issues.  High Risk Drug Classes Is patient taking? Indication by Medication  Antipsychotic No   Anticoagulant Yes Lovenox- VTE prophylaxis  Antibiotic No   Opioid No   Antiplatelet Yes DAPT (plavix/bASA)- CVA  Hypoglycemics/insulin No   Vasoactive Medication Yes Inderal, hctz, norvasc- hypertension  Chemotherapy No   Other Yes Xanax- anxiety Lipitor- HLD Cymbalta- MDD Enablex- overactive bladder Gabapentin- neuropathy Lamictal- seizure     Type of Medication Issue Identified Description of Issue Recommendation(s)  Drug Interaction(s) (clinically significant)     Duplicate Therapy     Allergy     No Medication Administration End Date  DAPT (Plavix/bASA) Neurology recommendations per note dated 11/25/2021- Continue DAPT x21 days (start date: 11/23/2021 end date: 12/13/2021) followed by bASA monotherapy to start 12/14/2021  Incorrect Dose     Additional Drug Therapy Needed     Significant med changes from prior encounter (inform family/care partners about these prior to discharge).    Other       Clinically significant medication issues were identified that warrant physician communication and completion of prescribed/recommended actions by midnight of the next day:  No  Pharmacist comments:   Time spent performing this drug regimen review (minutes):  30   Rayon Mcchristian BS, PharmD, BCPS Clinical Pharmacist 11/26/2021 2:33 PM

## 2021-11-26 NOTE — H&P (Signed)
Physical Medicine and Rehabilitation Admission H&P  CC: Acute bilateral watershed infarcts  HPI: Kristin Burgess is a 60 year old right-handed female with history of hypertension, tobacco use, AVM, with surgery 2015, seizure disorder maintained on Lamictal depression, morbid obesity BMI 39.93, .  Per chart review patient lives alone.  1 level home 5 steps to entry.  Modified independent for ambulation.  Family and friends assist with ADLs.  Presented to [facility] 11/22/2021 with progressive right leg weakness over an 8-day period as well as a recent fall when getting out of bed without loss of conscious.  She denied any lightheadedness or syncope.  Cranial CT scan showed subtle hypodensity of the deep white matter of the left parietal lobe consistent with infarction although age-indeterminate.  Unchanged nonacute encephalomalacia of the right parietal vertex.  CT angiogram head and neck no large vessel occlusion or significant stenosis.  No evidence of recurrent AVM.  MRI scattered small acute infarcts in the parasagittal left frontal parietal lobe much greater than right frontal lobe representing ACA/MCA watershed infarcts.  Patient did not receive tPA.  Echocardiogram with ejection fraction of 60 to 65% no wall motion abnormalities grade 1 diastolic dysfunction.  Admission chemistries unremarkable glucose 107, alcohol negative, hemoglobin A1c 5.9.  Neurology follow-up currently maintained on aspirin 81 mg daily and Plavix 75 mg daily for CVA prophylaxis x3 weeks then aspirin alone.  Subcutaneous Lovenox for DVT prophylaxis.  Tolerating a regular consistency diet.  Therapy evaluations completed due to patient's right side weakness and decreased functional mobility was admitted for a comprehensive rehab program. Currently complains of constipation.  Review of Systems  Constitutional:  Negative for chills and fever.  HENT:  Negative for hearing loss.   Eyes:  Negative for blurred vision and double vision.   Respiratory:  Negative for cough and shortness of breath.   Cardiovascular:  Negative for chest pain and palpitations.  Gastrointestinal:  Positive for constipation. Negative for heartburn, nausea and vomiting.  Genitourinary:  Negative for dysuria, flank pain and hematuria.  Musculoskeletal:  Positive for falls and myalgias.  Skin:  Negative for rash.  Neurological:  Positive for weakness and headaches.  Psychiatric/Behavioral:  Positive for depression. The patient has insomnia.   All other systems reviewed and are negative. Past Medical History:  Diagnosis Date   AVM (arteriovenous malformation) brain    Brain bleed (HCC)    Depression    Hyperlipidemia    Hypertension    Past Surgical History:  Procedure Laterality Date   BRAIN SURGERY  2015   TUBAL LIGATION     Family History  Problem Relation Age of Onset   Lung cancer Maternal Grandmother    Colon cancer Maternal Grandfather    Kidney cancer Neg Hx    Bladder Cancer Neg Hx    Social History:  reports that she has been smoking cigarettes. She has been smoking an average of .25 packs per day. She has never used smokeless tobacco. She reports that she does not drink alcohol and does not use drugs. Allergies:  Allergies  Allergen Reactions   Latex Itching   Medications Prior to Admission  Medication Sig Dispense Refill   acetaminophen (TYLENOL) 325 MG tablet Take 2 tablets (650 mg total) by mouth every 4 (four) hours as needed for mild pain (or temp > 37.5 C (99.5 F)).     amLODipine (NORVASC) 10 MG tablet Take 10 mg by mouth daily.     [START ON 11/27/2021] aspirin EC 81 MG EC tablet  Take 1 tablet (81 mg total) by mouth daily. Swallow whole. 30 tablet 11   [START ON 11/27/2021] atorvastatin (LIPITOR) 80 MG tablet Take 1 tablet (80 mg total) by mouth daily.     [START ON 11/27/2021] clopidogrel (PLAVIX) 75 MG tablet Take 1 tablet (75 mg total) by mouth daily.     DULoxetine (CYMBALTA) 30 MG capsule Take 30 mg by mouth  daily.     gabapentin (NEURONTIN) 300 MG capsule Take 300 mg by mouth every evening.     hydrochlorothiazide (HYDRODIURIL) 12.5 MG tablet Take 12.5 mg by mouth daily.     lamoTRIgine (LAMICTAL XR) 100 MG 24 hour tablet Take 100 mg by mouth daily.     propranolol (INDERAL) 10 MG tablet Take 10 mg by mouth 2 (two) times daily.     solifenacin (VESICARE) 10 MG tablet Take 10 mg by mouth daily.     traZODone (DESYREL) 50 MG tablet Take 50 mg by mouth at bedtime.      Drug Regimen Review Drug regimen was reviewed and remains appropriate with no significant issues identified  Home: Home Living Family/patient expects to be discharged to:: Private residence Living Arrangements: Alone Available Help at Discharge: Family, Available PRN/intermittently, Friend(s) Type of Home: House Home Access: Stairs to enter [employer] of Steps: 5 Entrance Stairs-Rails: Right (states it is wobbly and not safe to use) Home Layout: One level Bathroom Shower/Tub: Chiropodist: Standard Bathroom Accessibility: No Home Equipment: Rollator (4 wheels) Additional Comments: Does not have shower chair - sits on side of tub during shower due to decreased standing endurance. Uses Rollator (>1 year) due to decreased walking endurance.  Lives With: Alone   Functional History: Prior Function Prior Level of Function : Needs assist, History of Falls (last six months) Physical Assist : Mobility (physical), ADLs (physical) Mobility (physical): Stairs ADLs (physical): Bathing, Dressing, IADLs Mobility Comments: Reports Mod I with short distance ambulation and basic transfers. 1 fall reported over the past 6 months. ADLs Comments: Family and friends assist with getting into/out of the shower as well as process of showering, LB dressing, and IADLs - pt does attempt to assist with cooking.   Functional Status:  Mobility: Bed Mobility Overal bed mobility: Needs Assistance Bed Mobility: Supine  to Sit, Sit to Supine Supine to sit: Min assist, HOB elevated Sit to supine: Min assist General bed mobility comments:  (not assessed, pt up in chair upon arrival) Transfers Overall transfer level: Needs assistance Equipment used: Rolling walker (2 wheels) Transfers: Sit to/from Stand Sit to Stand: Mod assist, Max assist Bed to/from chair/wheelchair/BSC transfer type:: Stand pivot Stand pivot transfers: Max assist, +2 physical assistance  Lateral/Scoot Transfers: Min assist, +2 safety/equipment General transfer comment:  (MaxA needed to raise from commode, MaxA to maintain balance while completing hygeine in standing.) Ambulation/Gait Ambulation/Gait assistance: Min assist, Mod assist Gait Distance (Feet): 5 Feet Assistive device: Rolling walker (2 wheels) Gait Pattern/deviations: Step-to pattern, Trunk flexed, Ataxic General Gait Details:  (Pt ambulated 67ft x 2 requiring moderate blocking of R knee to prevent buckling during weight acceptance.)   ADL: ADL Overall ADL's : Needs assistance/impaired Grooming: Wash/dry face, Supervision/safety, Set up, Sitting Lower Body Dressing: Maximal assistance, Sitting/lateral leans Lower Body Dressing Details (indicate cue type and reason): to don socks Toileting- Clothing Manipulation and Hygiene: Maximal assistance, +2 for physical assistance, Sit to/from stand General ADL Comments: Pt SBA  for seated bathing, upper body and lower body~ 10 min. MAX A + 2 to  stand, pt able to access LB for perihygiene, standing, ~1 min before self initiating return to sit. Pt SBA + VCs  for don/doff socks and underwear, seated in bed, VCs for technique and saftey. Pt MAX A + 2 + RW +VCs for functional mobility, bed to chair, ambulating, ~ 20 ft.   Cognition: Cognition Overall Cognitive Status: Within Functional Limits for tasks assessed Orientation Level: Oriented X4 Cognition Arousal/Alertness: Awake/alert Behavior During Therapy: WFL for tasks  assessed/performed, Anxious, Impulsive Overall Cognitive Status: Within Functional Limits for tasks assessed General Comments:  (high anxiety this am about d/c)  Physical Exam: Blood pressure 134/70, pulse 65, temperature 98.1 F (36.7 C), temperature source Oral, resp. rate 16. Physical Exam Gen: no distress, normal appearing HEENT: oral mucosa pink and moist, NCAT Cardio: Reg rate Chest: normal effort, normal rate of breathing Abd: soft, non-distended Ext: no edema Psych: pleasant, normal affect Skin: intact Neurological:     Comments: Patient is alert.  Makes eye contact with examiner.  Follows commands.  Oriented to person place and time.  Fair awareness of deficits. 5/5 strength in upper extremities. RLE 3/5, LLE 5/5. Sensation decreased in right leg.   Results for orders placed or performed during the hospital encounter of 11/26/21 (from the past 48 hour(s))  CBC     Status: Abnormal   Collection Time: 11/26/21  2:58 PM  Result Value Ref Range   WBC 4.3 4.0 - 10.5 K/uL   RBC 6.33 (H) 3.87 - 5.11 MIL/uL   Hemoglobin 13.4 12.0 - 15.0 g/dL   HCT 11.9 41.7 - 40.8 %   MCV 68.7 (L) 80.0 - 100.0 fL   MCH 21.2 (L) 26.0 - 34.0 pg   MCHC 30.8 30.0 - 36.0 g/dL   RDW 14.4 (H) 81.8 - 56.3 %   Platelets 263 150 - 400 K/uL   nRBC 0.0 0.0 - 0.2 %    Comment: Performed at [facility], [address] [place], [place] [address]  Creatinine, serum     Status: None   Collection Time: 11/26/21  2:58 PM  Result Value Ref Range   Creatinine, Ser 0.90 0.44 - 1.00 mg/dL   GFR, Estimated >26 >37 mL/min    Comment: (NOTE) Calculated using the CKD-EPI Creatinine Equation (2021) Performed at [facility], [address] [place], [place] [address]    No results found.     Medical Problem List and Plan: 1.  Right side weakness functional deficits secondary to scattered acute infarct parasagittal left frontal parietal lobe much greater then right frontal lobe representing  ACA/MCA watershed infarcts  -patient may shower  -ELOS/Goals: 10-14 days modI 2.  Impaired mobility, ambulating 20 feet: continue lovenox.  -antiplatelet therapy: Aspirin 81 mg daily and Plavix 75 mg day x3 weeks total then aspirin alone 3. Pain Management: Neurontin 300 mg daily 4. Mood: Trazodone 50 mg nightly, Cymbalta 30 mg daily Xanax as needed  -antipsychotic agents: N/A 5. Neuropsych: This patient is capable of making decisions on her own behalf. 6. Skin/Wound Care: Routine skin checks 7. Fluids/Electrolytes/Nutrition: Routine in and outs with follow-up chemistries 8.  Hypertension.  HCTZ 12.5 mg daily, Norvasc 10 mg daily, Inderal 10 mg twice daily.  Monitor with increased mobility 9.  History of AVM with surgery 2015.  Follow-up outpatient 10.  Seizure disorder.  Continue Lamictal 100 mg nightly 11.  Hyperlipidemia.  Lipitor 12.  Tobacco abuse.  Provide counseling 13.  Obesity.  BMI 43.93.  Dietary follow-up 14.Insomnia/constipation/hypertension/anxiety:  start magnesium gluconate 250mg   HS  Lavon Paganini Angiulli, PA-C   I have personally performed a face to face diagnostic evaluation, including, but not limited to relevant history and physical exam findings, of this patient and developed relevant assessment and plan.  Additionally, I have reviewed and concur with the physician assistant's documentation above.  Izora Ribas, MD 11/26/2021

## 2021-11-26 NOTE — Discharge Instructions (Addendum)
Inpatient Rehab Discharge Instructions  Kristin Burgess Discharge date and time: No discharge date for patient encounter.   Activities/Precautions/ Functional Status: Activity: activity as tolerated Diet: regular diet Wound Care: Routine skin checks Functional status:  ___ No restrictions     ___ Walk up steps independently ___ 24/7 supervision/assistance   ___ Walk up steps with assistance ___ Intermittent supervision/assistance  ___ Bathe/dress independently ___ Walk with walker     _x__ Bathe/dress with assistance ___ Walk Independently    ___ Shower independently ___ Walk with assistance    ___ Shower with assistance ___ No alcohol     ___ Return to work/school ________  COMMUNITY REFERRALS UPON DISCHARGE:     Outpatient: PT     OT                Agency: [facility]  Phone: [phone]              Appointment Date/Time: TBD  Medical Equipment/Items Ordered: Rollator, Bedside Commode, Transfer Bench                                                 Agency/Supplier:[employer]    Special Instructions: No driving smoking or alcohol  Continue aspirin 81 mg daily and Plavix 75 mg day x9 more days then aspirin alone   My questions have been answered and I understand these instructions. I will adhere to these goals and the provided educational materials after my discharge from the hospital.  Patient/Caregiver Signature _______________________________ Date __________  Clinician Signature _______________________________________ Date __________  Please bring this form and your medication list with you to all your follow-up doctor's appointments.  STROKE/TIA DISCHARGE INSTRUCTIONS SMOKING Cigarette smoking nearly doubles your risk of having a stroke & is the single most alterable risk factor  If you smoke or have smoked in the last 12 months, you are advised to quit smoking for your health. Most of the excess cardiovascular risk related to smoking disappears within a year of stopping. Ask  you doctor about anti-smoking medications [place] Quit Line: 1-800-QUIT NOW Free Smoking Cessation Classes [phone]  CHOLESTEROL Know your levels; limit fat & cholesterol in your diet  Lipid Panel     Component Value Date/Time   CHOL 152 11/22/2021 1205   TRIG 39 11/22/2021 1205   HDL 57 11/22/2021 1205   CHOLHDL 2.7 11/22/2021 1205   VLDL 8 11/22/2021 1205   LDLCALC 87 11/22/2021 1205     Many patients benefit from treatment even if their cholesterol is at goal. Goal: Total Cholesterol (CHOL) less than 160 Goal:  Triglycerides (TRIG) less than 150 Goal:  HDL greater than 40 Goal:  LDL (LDLCALC) less than 100   BLOOD PRESSURE American Stroke Association blood pressure target is less that 120/80 mm/Hg  Your discharge blood pressure is:    Monitor your blood pressure Limit your salt and alcohol intake Many individuals will require more than one medication for high blood pressure  DIABETES (A1c is a blood sugar average for last 3 months) Goal HGBA1c is under 7% (HBGA1c is blood sugar average for last 3 months)  Diabetes: No known diagnosis of diabetes    Lab Results  Component Value Date   HGBA1C 5.9 (H) 11/23/2021    Your HGBA1c can be lowered with medications, healthy diet, and exercise. Check your blood sugar as directed by  your physician Call your physician if you experience unexplained or low blood sugars.  PHYSICAL ACTIVITY/REHABILITATION Goal is 30 minutes at least 4 days per week  Activity: Increase activity slowly, Therapies: Physical Therapy: Home Health Return to work:  Activity decreases your risk of heart attack and stroke and makes your heart stronger.  It helps control your weight and blood pressure; helps you relax and can improve your mood. Participate in a regular exercise program. Talk with your doctor about the best form of exercise for you (dancing, walking, swimming, cycling).  DIET/WEIGHT Goal is to maintain a healthy weight  Your discharge diet is:   Diet Order             Diet Heart Room service appropriate? Yes; Fluid consistency: Thin  Diet effective now                   liquids Your height is:    Your current weight is:   Your Body Mass Index (BMI) is:    Following the type of diet specifically designed for you will help prevent another stroke. Your goal weight range is:   Your goal Body Mass Index (BMI) is 19-24. Healthy food habits can help reduce 3 risk factors for stroke:  High cholesterol, hypertension, and excess weight.  RESOURCES Stroke/Support Group:  Call [phone]   STROKE EDUCATION PROVIDED/REVIEWED AND GIVEN TO PATIENT Stroke warning signs and symptoms How to activate emergency medical system (call 911). Medications prescribed at discharge. Need for follow-up after discharge. Personal risk factors for stroke. Pneumonia vaccine given: No Flu vaccine given: No My questions have been answered, the writing is legible, and I understand these instructions.  I will adhere to these goals & educational materials that have been provided to me after my discharge from the hospital.

## 2021-11-26 NOTE — H&P (Incomplete)
Physical Medicine and Rehabilitation Admission H&P    Chief Complaint  Patient presents with   Weakness  : HPI: Kristin Burgess is a 60 year old right-handed female with history of hypertension, tobacco use, AVM, with surgery 2015, seizure disorder maintained on Lamictal depression, morbid obesity BMI 39.93, .  Per chart review patient lives alone.  1 level home 5 steps to entry.  Modified independent for ambulation.  Family and friends assist with ADLs.  Presented to [facility] 11/22/2021 with progressive right leg weakness over an 8-day period as well as a recent fall when getting out of bed without loss of conscious.  She denied any lightheadedness or syncope.  Cranial CT scan showed subtle hypodensity of the deep white matter of the left parietal lobe consistent with infarction although age-indeterminate.  Unchanged nonacute encephalomalacia of the right parietal vertex.  CT angiogram head and neck no large vessel occlusion or significant stenosis.  No evidence of recurrent AVM.  MRI scattered small acute infarcts in the parasagittal left frontal parietal lobe much greater than right frontal lobe representing ACA/MCA watershed infarcts.  Patient did not receive tPA.  Echocardiogram with ejection fraction of 60 to 65% no wall motion abnormalities grade 1 diastolic dysfunction.  Admission chemistries unremarkable glucose 107, alcohol negative, hemoglobin A1c 5.9.  Neurology follow-up currently maintained on aspirin 81 mg daily and Plavix 75 mg daily for CVA prophylaxis x3 weeks then aspirin alone.  Subcutaneous Lovenox for DVT prophylaxis.  Tolerating a regular consistency diet.  Therapy evaluations completed due to patient's right side weakness and decreased functional mobility was admitted for a comprehensive rehab program.  Review of Systems  Constitutional:  Negative for chills and fever.  HENT:  Negative for hearing loss.   Eyes:  Negative for blurred vision and double vision.  Respiratory:  Negative  for cough and shortness of breath.   Cardiovascular:  Negative for chest pain and palpitations.  Gastrointestinal:  Positive for constipation. Negative for heartburn, nausea and vomiting.  Genitourinary:  Negative for dysuria, flank pain and hematuria.  Musculoskeletal:  Positive for falls and myalgias.  Skin:  Negative for rash.  Neurological:  Positive for weakness and headaches.  Psychiatric/Behavioral:  Positive for depression. The patient has insomnia.   All other systems reviewed and are negative. Past Medical History:  Diagnosis Date   AVM (arteriovenous malformation) brain    Brain bleed (HCC)    Depression    Hyperlipidemia    Hypertension    Past Surgical History:  Procedure Laterality Date   BRAIN SURGERY  2015   TUBAL LIGATION     Family History  Problem Relation Age of Onset   Lung cancer Maternal Grandmother    Colon cancer Maternal Grandfather    Kidney cancer Neg Hx    Bladder Cancer Neg Hx    Social History:  reports that she has been smoking cigarettes. She has been smoking an average of .25 packs per day. She has never used smokeless tobacco. She reports that she does not drink alcohol and does not use drugs. Allergies:  Allergies  Allergen Reactions   Latex Itching   Medications Prior to Admission  Medication Sig Dispense Refill   amLODipine (NORVASC) 10 MG tablet Take 10 mg by mouth daily.     atorvastatin (LIPITOR) 40 MG tablet Take 40 mg by mouth daily.     DULoxetine (CYMBALTA) 30 MG capsule Take 30 mg by mouth daily.     gabapentin (NEURONTIN) 300 MG capsule Take 300 mg by  mouth every evening.     hydrochlorothiazide (HYDRODIURIL) 12.5 MG tablet Take 12.5 mg by mouth daily.     lamoTRIgine (LAMICTAL XR) 100 MG 24 hour tablet Take 100 mg by mouth daily.     propranolol (INDERAL) 10 MG tablet Take 10 mg by mouth 2 (two) times daily.     solifenacin (VESICARE) 10 MG tablet Take 10 mg by mouth daily.     traZODone (DESYREL) 50 MG tablet Take 50 mg by  mouth at bedtime.      Drug Regimen Review Drug regimen was reviewed and remains appropriate with no significant issues identified  Home: Home Living Family/patient expects to be discharged to:: Private residence Living Arrangements: Alone Available Help at Discharge: Family, Available PRN/intermittently, Friend(s) Type of Home: House Home Access: Stairs to enter [employer] of Steps: 5 Entrance Stairs-Rails: Right (states it is wobbly and not safe to use) Home Layout: One level Bathroom Shower/Tub: Chiropodist: Standard Bathroom Accessibility: No Home Equipment: Rollator (4 wheels) Additional Comments: Does not have shower chair - sits on side of tub during shower due to decreased standing endurance. Uses Rollator (>1 year) due to decreased walking endurance.  Lives With: Alone   Functional History: Prior Function Prior Level of Function : Needs assist, History of Falls (last six months) Physical Assist : Mobility (physical), ADLs (physical) Mobility (physical): Stairs ADLs (physical): Bathing, Dressing, IADLs Mobility Comments: Reports Mod I with short distance ambulation and basic transfers. 1 fall reported over the past 6 months. ADLs Comments: Family and friends assist with getting into/out of the shower as well as process of showering, LB dressing, and IADLs - pt does attempt to assist with cooking.  Functional Status:  Mobility: Bed Mobility Overal bed mobility: Needs Assistance Bed Mobility: Supine to Sit, Sit to Supine Supine to sit: Min assist, HOB elevated Sit to supine: Min assist General bed mobility comments:  (not assessed, pt up in chair upon arrival) Transfers Overall transfer level: Needs assistance Equipment used: Rolling walker (2 wheels) Transfers: Sit to/from Stand Sit to Stand: Mod assist, Max assist Bed to/from chair/wheelchair/BSC transfer type:: Stand pivot Stand pivot transfers: Max assist, +2 physical  assistance  Lateral/Scoot Transfers: Min assist, +2 safety/equipment General transfer comment:  (MaxA needed to raise from commode, MaxA to maintain balance while completing hygeine in standing.) Ambulation/Gait Ambulation/Gait assistance: Min assist, Mod assist Gait Distance (Feet): 5 Feet Assistive device: Rolling walker (2 wheels) Gait Pattern/deviations: Step-to pattern, Trunk flexed, Ataxic General Gait Details:  (Pt ambulated 28ft x 2 requiring moderate blocking of R knee to prevent buckling during weight acceptance.)    ADL: ADL Overall ADL's : Needs assistance/impaired Grooming: Wash/dry face, Supervision/safety, Set up, Sitting Lower Body Dressing: Maximal assistance, Sitting/lateral leans Lower Body Dressing Details (indicate cue type and reason): to don socks Toileting- Clothing Manipulation and Hygiene: Maximal assistance, +2 for physical assistance, Sit to/from stand General ADL Comments: Pt SBA  for seated bathing, upper body and lower body~ 10 min. MAX A + 2 to stand, pt able to access LB for perihygiene, standing, ~1 min before self initiating return to sit. Pt SBA + VCs  for don/doff socks and underwear, seated in bed, VCs for technique and saftey. Pt MAX A + 2 + RW +VCs for functional mobility, bed to chair, ambulating, ~ 20 ft.  Cognition: Cognition Overall Cognitive Status: Within Functional Limits for tasks assessed Orientation Level: Oriented X4 Cognition Arousal/Alertness: Awake/alert Behavior During Therapy: WFL for tasks assessed/performed, Anxious, Impulsive Overall  Cognitive Status: Within Functional Limits for tasks assessed General Comments:  (high anxiety this am about d/c)  Physical Exam: Blood pressure 131/76, pulse 69, temperature 99.2 F (37.3 C), resp. rate 18, height 5\' 5"  (1.651 m), weight 119.7 kg, SpO2 97 %. Physical Exam Neurological:     Comments: Patient is alert.  Makes eye contact with examiner.  Follows commands.  Oriented to person place  and time.  Fair awareness of deficits.    Results for orders placed or performed during the hospital encounter of 11/22/21 (from the past 48 hour(s))  CBC     Status: Abnormal   Collection Time: 11/25/21  4:48 AM  Result Value Ref Range   WBC 4.9 4.0 - 10.5 K/uL   RBC 5.83 (H) 3.87 - 5.11 MIL/uL   Hemoglobin 12.3 12.0 - 15.0 g/dL   HCT 14/06/22 61.9 - 50.9 %   MCV 68.6 (L) 80.0 - 100.0 fL   MCH 21.1 (L) 26.0 - 34.0 pg   MCHC 30.8 30.0 - 36.0 g/dL   RDW 32.6 (H) 71.2 - 45.8 %   Platelets 225 150 - 400 K/uL   nRBC 0.0 0.0 - 0.2 %    Comment: Performed at [facility], [address] [place], [place]  Basic metabolic panel     Status: None   Collection Time: 11/25/21  4:48 AM  Result Value Ref Range   Sodium 140 135 - 145 mmol/L   Potassium 3.9 3.5 - 5.1 mmol/L   Chloride 107 98 - 111 mmol/L   CO2 26 22 - 32 mmol/L   Glucose, Bld 95 70 - 99 mg/dL    Comment: Glucose reference range applies only to samples taken after fasting for at least 8 hours.   BUN 16 6 - 20 mg/dL   Creatinine, Ser 14/06/22 0.44 - 1.00 mg/dL   Calcium 9.2 8.9 - 5.05 mg/dL   GFR, Estimated 39.7 >67 mL/min    Comment: (NOTE) Calculated using the CKD-EPI Creatinine Equation (2021)    Anion gap 7 5 - 15    Comment: Performed at [facility], [address] [place], [place]  CBC     Status: Abnormal   Collection Time: 11/26/21  3:41 AM  Result Value Ref Range   WBC 3.6 (L) 4.0 - 10.5 K/uL   RBC 6.16 (H) 3.87 - 5.11 MIL/uL   Hemoglobin 12.8 12.0 - 15.0 g/dL   HCT 14/07/22 02.4 - 09.7 %   MCV 68.5 (L) 80.0 - 100.0 fL   MCH 20.8 (L) 26.0 - 34.0 pg   MCHC 30.3 30.0 - 36.0 g/dL   RDW 35.3 (H) 29.9 - 24.2 %   Platelets 238 150 - 400 K/uL    Comment: REPEATED TO VERIFY   nRBC 0.0 0.0 - 0.2 %    Comment: Performed at [facility], [address], [place], [place]  Basic metabolic panel     Status: Abnormal   Collection Time: 11/26/21  3:41 AM  Result Value Ref  Range   Sodium 139 135 - 145 mmol/L   Potassium 3.9 3.5 - 5.1 mmol/L   Chloride 105 98 - 111 mmol/L   CO2 25 22 - 32 mmol/L   Glucose, Bld 96 70 - 99 mg/dL    Comment: Glucose reference range applies only to samples taken after fasting for at least 8 hours.   BUN 21 (H) 6 - 20 mg/dL   Creatinine, Ser 14/07/22 0.44 - 1.00  mg/dL   Calcium 9.2 8.9 - 10.3 mg/dL   GFR, Estimated >60 >60 mL/min    Comment: (NOTE) Calculated using the CKD-EPI Creatinine Equation (2021)    Anion gap 9 5 - 15    Comment: Performed at [facility], [address] [place], [place] [address]   No results found.     Medical Problem List and Plan: 1.  Right side weakness functional deficits secondary to scattered acute infarct parasagittal left frontal parietal lobe much greater then right frontal lobe representing ACA/MCA watershed infarcts  -patient may *** shower  -ELOS/Goals: *** 2.  Antithrombotics: -DVT/anticoagulation:  Pharmaceutical: Lovenox  -antiplatelet therapy: Aspirin 81 mg daily and Plavix 75 mg day x3 weeks total then aspirin alone 3. Pain Management: Neurontin 300 mg daily 4. Mood: Trazodone 50 mg nightly, Cymbalta 30 mg daily Xanax as needed  -antipsychotic agents: N/A 5. Neuropsych: This patient is capable of making decisions on her own behalf. 6. Skin/Wound Care: Routine skin checks 7. Fluids/Electrolytes/Nutrition: Routine in and outs with follow-up chemistries 8.  Hypertension.  HCTZ 12.5 mg daily, Norvasc 10 mg daily, Inderal 10 mg twice daily.  Monitor with increased mobility 9.  History of AVM with surgery 2015.  Follow-up outpatient 10.  Seizure disorder.  Lamictal 100 mg nightly 11.  Hyperlipidemia.  Lipitor 12.  Tobacco abuse.  Provide counseling 13.  Obesity.  BMI 43.93.  Dietary follow-up   Cathlyn Parsons, PA-C 11/26/2021

## 2021-11-26 NOTE — TOC Progression Note (Signed)
Transition of Care [facility]) - Progression Note    Patient Details  Name: Kristin Burgess MRN: [id number] Date of Birth: 20-Sep-1961  [facility] CM/SW Contact  Caryn Section, RN Phone Number: 11/26/2021, 10:07 AM  Clinical Narrative:   As per Vernona Rieger from [facility], patient has a bed today.  Transport forms completed, awaiting [facility] to transport.    Expected Discharge Plan:  (refuses SNF) Barriers to Discharge: Continued Medical Work up  Expected Discharge Plan and Services Expected Discharge Plan:  (refuses SNF)   Discharge Planning Services: CM Consult Post Acute Care Choice: Home Health Living arrangements for the past 2 months: Single Family Home Expected Discharge Date: 11/26/21                                     Social Determinants of Health (SDOH) Interventions    Readmission Risk Interventions No flowsheet data found.

## 2021-11-26 NOTE — Progress Notes (Signed)
PMR Admission Coordinator Pre-Admission Assessment   Patient: Kristin Burgess is an 60 y.o., female MRN: [id number] DOB: 05/19/1961 Height: 5\' 5"  (165.1 cm) Weight: 119.7 kg   Insurance Information HMO:     PPO:      PCP:      IPA:      80/20: no     OTHER:  PRIMARY: Medicare       Policy#:   [id number]    Subscriber: Pt. Phone#: Verified online    Fax#:  Pre-Cert#:       Employer:  Benefits:  Phone #:      Name:  Eff. Date: Parts A ad B effective  Deduct: $1556      Out of Pocket Max:  None      Life Max: N/A  CIR: 100%      SNF: 100 days Outpatient: 80%     Co-Pay: 20% Home Health: 100%      Co-Pay: none DME: 80%     Co-Pay: 20% Providers: patient's choice SECONDARY: Medicaid of [place]      Policy#: [id number]     Phone#:  Financial Counselor:       Phone#:    The Therapist, art Information Summary" for patients in Inpatient Rehabilitation Facilities with attached "Privacy Act [place] Records" was provided and verbally reviewed with: Patient   Emergency Contact Information Contact Information       Name Relation Home Work Mobile    Mitschke,Geraldine Mother [phone]               Current Medical History  Patient Admitting Diagnosis: CVA       History of Present Illness:  Kristin Burgess is a 60 year old right-handed female with history of hypertension, tobacco use, AVM, with surgery 2015, seizure disorder maintained on Lamictal depression, morbid obesity BMI 39.93, .  Per chart review patient lives alone.  1 level home 5 steps to entry.  Modified independent for ambulation.  Family and friends assist with ADLs.  Presented to [facility] 11/22/2021 with progressive right leg weakness over an 8-day period as well as a recent fall when getting out of bed without loss of conscious.  She denied any lightheadedness or syncope.  Cranial CT scan showed subtle hypodensity of the deep white matter of the left parietal lobe consistent with infarction although age-indeterminate.  Unchanged  nonacute encephalomalacia of the right parietal vertex.  CT angiogram head and neck no large vessel occlusion or significant stenosis.  No evidence of recurrent AVM.  MRI scattered small acute infarcts in the parasagittal left frontal parietal lobe much greater than right frontal lobe representing ACA/MCA watershed infarcts.  Patient did not receive tPA.  Echocardiogram with ejection fraction of 60 to 65% no wall motion abnormalities grade 1 diastolic dysfunction.  Admission chemistries unremarkable glucose 107, alcohol negative, hemoglobin A1c 5.9.  Neurology follow-up currently maintained on aspirin 81 mg daily and Plavix 75 mg daily for CVA prophylaxis x3 weeks then aspirin alone.  Subcutaneous Lovenox for DVT prophylaxis.  Tolerating a regular consistency diet.  Therapy evaluations completed due to patient's right side weakness and decreased functional mobility was admitted for a comprehensive rehab program.   Complete NIHSS TOTAL: 3   Patient's medical record from [facility] has been reviewed by the rehabilitation admission coordinator and physician.   Past Medical History      Past Medical History:  Diagnosis Date   AVM (arteriovenous malformation) brain     Brain bleed (HCC)  Depression     Hyperlipidemia     Hypertension        Has the patient had major surgery during 100 days prior to admission? No   Family History   family history includes Colon cancer in her maternal grandfather; Lung cancer in her maternal grandmother.   Current Medications   Current Facility-Administered Medications:    acetaminophen (TYLENOL) tablet 650 mg, 650 mg, Oral, Q4H PRN **OR** acetaminophen (TYLENOL) 160 MG/5ML solution 650 mg, 650 mg, Per Tube, Q4H PRN **OR** acetaminophen (TYLENOL) suppository 650 mg, 650 mg, Rectal, Q4H PRN, Clarnce Flock, MD   ALPRAZolam Duanne Moron) tablet 0.25 mg, 0.25 mg, Oral, BID PRN, Wyvonnia Dusky, MD, 0.25 mg at 11/26/21 [id number]   amLODipine  (NORVASC) tablet 10 mg, 10 mg, Oral, Daily, Clarnce Flock, MD, 10 mg at 11/25/21 0846   aspirin EC tablet 81 mg, 81 mg, Oral, Daily, Greta Doom, MD, 81 mg at 11/26/21 0939   atorvastatin (LIPITOR) tablet 80 mg, 80 mg, Oral, Daily, Greta Doom, MD, 80 mg at 11/26/21 0940   clopidogrel (PLAVIX) tablet 75 mg, 75 mg, Oral, Daily, Greta Doom, MD, 75 mg at 11/26/21 0939   darifenacin (ENABLEX) 24 hr tablet 15 mg, 15 mg, Oral, Daily, Clarnce Flock, MD, 15 mg at 11/26/21 0939   docusate sodium (COLACE) capsule 200 mg, 200 mg, Oral, BID, Wyvonnia Dusky, MD, 200 mg at 11/26/21 [id number]   DULoxetine (CYMBALTA) DR capsule 30 mg, 30 mg, Oral, Daily, Clarnce Flock, MD, 30 mg at 11/26/21 0940   enoxaparin (LOVENOX) injection 40 mg, 40 mg, Subcutaneous, Q24H, Clarnce Flock, MD, 40 mg at 11/25/21 2158   gabapentin (NEURONTIN) capsule 300 mg, 300 mg, Oral, QPM, Clarnce Flock, MD, 300 mg at 11/25/21 1754   hydrALAZINE (APRESOLINE) injection 20 mg, 20 mg, Intravenous, Q8H PRN, Wyvonnia Dusky, MD, 20 mg at 11/23/21 1526   hydrochlorothiazide (HYDRODIURIL) tablet 12.5 mg, 12.5 mg, Oral, Daily, Clarnce Flock, MD, 12.5 mg at 11/25/21 [id number]   lamoTRIgine (LAMICTAL XR) 24 hour tablet 100 mg, 100 mg, Oral, QHS, Clarnce Flock, MD, 100 mg at 11/25/21 2157   propranolol (INDERAL) tablet 10 mg, 10 mg, Oral, BID, Clarnce Flock, MD, 10 mg at 11/26/21 1001   traZODone (DESYREL) tablet 50 mg, 50 mg, Oral, QHS, Clarnce Flock, MD, 50 mg at 11/25/21 2157   Patients Current Diet:  Diet Order                  Diet Heart Room service appropriate? Yes; Fluid consistency: Thin  Diet effective now                         Precautions / Restrictions Precautions Precautions: Fall Restrictions Weight Bearing Restrictions: No    Has the patient had 2 or more falls or a fall with injury in the past year? No   Prior Activity Level  Pt. Went out  1-2x a week   Prior Functional Level Self Care: Did the patient need help bathing, dressing, using the toilet or eating? Needed some help   Indoor Mobility: Did the patient need assistance with walking from room to room (with or without device)? Needed some help   Stairs: Did the patient need assistance with internal or external stairs (with or without device)? Needed some help   Functional Cognition: Did the patient need help planning regular tasks such as shopping or  remembering to take medications? Needed some help   Patient Information Are you of Hispanic, Latino/a,or Spanish origin?: A. No, not of Hispanic, Latino/a, or Spanish origin What is your race?: A. White Do you need or want an interpreter to communicate with a doctor or health care staff?: 0. No   Patient's Response To:  Health Literacy and Transportation Is the patient able to respond to health literacy and transportation needs?: Yes Health Literacy - How often do you need to have someone help you when you read instructions, pamphlets, or other written material from your doctor or pharmacy?: Always In the past 12 months, has lack of transportation kept you from medical appointments or from getting medications?: Yes In the past 12 months, has lack of transportation kept you from meetings, work, or from getting things needed for daily living?: Yes   [place] Devices/Equipment: Environmental consultant (specify type) Home Equipment: Rollator (4 wheels)   Prior Device Use: Indicate devices/aids used by the patient prior to current illness, exacerbation or injury? Manual wheelchair and Walker   Current Functional Level Cognition   Overall Cognitive Status: Within Functional Limits for tasks assessed Orientation Level: Oriented X4 General Comments:  (high anxiety this am about d/c)    Extremity Assessment (includes Sensation/Coordination)   Upper Extremity Assessment: LUE deficits/detail LUE  Deficits / Details: 3/5 GH flexion. 5/5 elbow flexion and extension. Diminshed grip strength (significantly compared to R). LUE: Shoulder pain with ROM, Shoulder pain at rest LUE Sensation: decreased light touch  Lower Extremity Assessment: Defer to PT evaluation RLE Deficits / Details: <3/5 hip flexion (unable to achieve full range), 3/5 knee extension and DF. Inability to sustain contraction for MMT. Pt reports hypersensitivity with LT and deep pressure testing. Decreased coordination with heel-to-shin test.     ADLs   Overall ADL's : Needs assistance/impaired Grooming: Wash/dry face, Supervision/safety, Set up, Sitting Lower Body Dressing: Maximal assistance, Sitting/lateral leans Lower Body Dressing Details (indicate cue type and reason): to don socks Toileting- Clothing Manipulation and Hygiene: Maximal assistance, +2 for physical assistance, Sit to/from stand General ADL Comments: Pt SBA  for seated bathing, upper body and lower body~ 10 min. MAX A + 2 to stand, pt able to access LB for perihygiene, standing, ~1 min before self initiating return to sit. Pt SBA + VCs  for don/doff socks and underwear, seated in bed, VCs for technique and saftey. Pt MAX A + 2 + RW +VCs for functional mobility, bed to chair, ambulating, ~ 20 ft.     Mobility   Overal bed mobility: Needs Assistance Bed Mobility: Supine to Sit, Sit to Supine Supine to sit: Min assist, HOB elevated Sit to supine: Min assist General bed mobility comments:  (not assessed, pt up in chair upon arrival)     Transfers   Overall transfer level: Needs assistance Equipment used: Rolling walker (2 wheels) Transfers: Sit to/from Stand Sit to Stand: Mod assist, Max assist Bed to/from chair/wheelchair/BSC transfer type:: Stand pivot Stand pivot transfers: Max assist, +2 physical assistance  Lateral/Scoot Transfers: Min assist, +2 safety/equipment General transfer comment:  (MaxA needed to raise from commode, MaxA to maintain balance  while completing hygeine in standing.)     Ambulation / Gait / Stairs / Wheelchair Mobility   Ambulation/Gait Ambulation/Gait assistance: Min assist, Mod assist Gait Distance (Feet): 5 Feet Assistive device: Rolling walker (2 wheels) Gait Pattern/deviations: Step-to pattern, Trunk flexed, Ataxic General Gait Details:  (Pt ambulated 77ft x 2 requiring moderate blocking  of R knee to prevent buckling during weight acceptance.)     Posture / Balance Dynamic Sitting Balance Sitting balance - Comments: Good sitting balance reaching without BOS at EOB. Oberserves to have posterior trunk lean during lateral/scoot transfers Balance Overall balance assessment: Needs assistance Sitting-balance support: No upper extremity supported, Feet supported Sitting balance-Leahy Scale: Good Sitting balance - Comments: Good sitting balance reaching without BOS at EOB. Oberserves to have posterior trunk lean during lateral/scoot transfers Postural control: Posterior lean Standing balance support: Bilateral upper extremity supported, During functional activity, Reliant on assistive device for balance Standing balance-Leahy Scale: Poor Standing balance comment: MAX A x2 to maintain static standing position utilizing RW for BUE support. Pt had difficulty positioning hands safely/correctly on RW.     Special needs/care consideration Skin WNL and Special service needs none    Previous Home Environment (from acute therapy documentation) Living Arrangements: Alone  Lives With: Alone Available Help at Discharge: Family, Available PRN/intermittently, Friend(s) Type of Home: House Home Layout: One level Home Access: Stairs to enter Entrance Stairs-Rails: Right (states it is wobbly and not safe to use) Entrance Stairs-Number of Steps: 5 Bathroom Shower/Tub: Chiropodist: Standard Bathroom Accessibility: No Home Care Services: No Additional Comments: Does not have shower chair - sits on side of  tub during shower due to decreased standing endurance. Uses Rollator (>1 year) due to decreased walking endurance.   Discharge Living Setting Plans for Discharge Living Setting: Patient's home Type of Home at Discharge: House Discharge Home Layout: One level Discharge Home Access: Stairs to enter Entrance Stairs-Rails: Right Entrance Stairs-Number of Steps: 4 Discharge Bathroom Shower/Tub: Tub/shower unit Discharge Bathroom Toilet: Standard Discharge Bathroom Accessibility: Yes How Accessible: Accessible via walker Does the patient have any problems obtaining your medications?: No   Social/Family/Support Systems Patient Roles: Other (Comment) Contact Information: [phone] Anticipated Caregiver: Jannette Fogo (caregiver/friend) Anticipated Caregiver's Contact Information: [phone] Ability/Limitations of Caregiver: Can provide min-mod A Caregiver Availability: 24/7 Discharge Plan Discussed with Primary Caregiver: Yes Is Caregiver In Agreement with Plan?: Yes Does Caregiver/Family have Issues with Lodging/Transportation while Pt is in Rehab?: No   Goals Patient/Family Goal for Rehab: PT/OT Mod A Expected length of stay: 16-18 days Pt/Family Agrees to Admission and willing to participate: Yes Program Orientation Provided & Reviewed with Pt/Caregiver Including Roles  & Responsibilities: Yes   Decrease burden of Care through IP rehab admission: Specialzed equipment needs, Bowel and bladder program, and Patient/family education   Possible need for SNF placement upon discharge: not anticipated   Patient Condition: I have reviewed medical records from [facility], spoken with CM, and patient. I discussed via phone for inpatient rehabilitation assessment.  Patient will benefit from ongoing PT and OT, can actively participate in 3 hours of therapy a day 5 days of the week, and can make measurable gains during the admission.  Patient will also benefit from the  coordinated team approach during an Inpatient Acute Rehabilitation admission.  The patient will receive intensive therapy as well as Rehabilitation physician, nursing, social worker, and care management interventions.  Due to bladder management, bowel management, safety, skin/wound care, disease management, medication administration, pain management, and patient education the patient requires 24 hour a day rehabilitation nursing.  The patient is currently min-max A  with mobility and basic ADLs.  Discharge setting and therapy post discharge at home with home health is anticipated.  Patient has agreed to participate in the Acute Inpatient Rehabilitation Program and will admit today.   Preadmission  Screen Completed By:  Jeronimo Greaves, 11/26/2021 10:11 AM ______________________________________________________________________   Discussed status with Dr. Carlis Abbott  on 11/26/21 at 930 and received approval for admission today.   Admission Coordinator:  Jeronimo Greaves, CCC-SLP, time 1010/Date 11/26/21    Assessment/Plan: Diagnosis: CVA Does the need for close, 24 hr/day Medical supervision in concert with the patient's rehab needs make it unreasonable for this patient to be served in a less intensive setting? Yes Co-Morbidities requiring supervision/potential complications: HTN, tobacco use, AVM, seizure disorder, depression, morbid obesity Due to bladder management, bowel management, safety, skin/wound care, disease management, medication administration, pain management, and patient education, does the patient require 24 hr/day rehab nursing? Yes Does the patient require coordinated care of a physician, rehab nurse, PT, OT to address physical and functional deficits in the context of the above medical diagnosis(es)? Yes Addressing deficits in the following areas: balance, endurance, locomotion, strength, transferring, bowel/bladder control, bathing, dressing, feeding, grooming, toileting, and psychosocial  support Can the patient actively participate in an intensive therapy program of at least 3 hrs of therapy 5 days a week? Yes The potential for patient to make measurable gains while on inpatient rehab is excellent Anticipated functional outcomes upon discharge from inpatient rehab: supervision PT, supervision OT, independent SLP Estimated rehab length of stay to reach the above functional goals is: 10-14 days Anticipated discharge destination: Home 10. Overall Rehab/Functional Prognosis: excellent     MD Signature: Sula Soda, MD

## 2021-11-27 DIAGNOSIS — I6389 Other cerebral infarction: Secondary | ICD-10-CM | POA: Diagnosis not present

## 2021-11-27 MED ORDER — SENNOSIDES-DOCUSATE SODIUM 8.6-50 MG PO TABS
2.0000 | ORAL_TABLET | Freq: Two times a day (BID) | ORAL | Status: DC
  Administered 2021-11-27 – 2021-12-05 (×16): 2 via ORAL
  Filled 2021-11-27 (×17): qty 2

## 2021-11-27 MED ORDER — LACTULOSE 10 GM/15ML PO SOLN
30.0000 g | Freq: Every day | ORAL | Status: DC | PRN
  Filled 2021-11-27: qty 45

## 2021-11-27 NOTE — Progress Notes (Signed)
Inpatient Rehabilitation  Patient information reviewed and entered into eRehab system by Baldwin Racicot M. Ryeleigh Santore, M.A., CCC/SLP, PPS Coordinator.  Information including medical coding, functional ability and quality indicators will be reviewed and updated through discharge.    

## 2021-11-27 NOTE — Progress Notes (Signed)
PROGRESS NOTE   Subjective/Complaints:  No issues overnite   ROS- neg CP, SOB, N/V/D  Objective:   No results found. Recent Labs    11/26/21 0341 11/26/21 1458  WBC 3.6* 4.3  HGB 12.8 13.4  HCT 42.2 43.5  PLT 238 263   Recent Labs    11/25/21 0448 11/26/21 0341 11/26/21 1458  NA 140 139  --   K 3.9 3.9  --   CL 107 105  --   CO2 26 25  --   GLUCOSE 95 96  --   BUN 16 21*  --   CREATININE 0.71 0.73 0.90  CALCIUM 9.2 9.2  --     Intake/Output Summary (Last 24 hours) at 11/27/2021 0759 Last data filed at 11/26/2021 1839 Gross per 24 hour  Intake 240 ml  Output --  Net 240 ml        Physical Exam: Vital Signs Blood pressure 135/64, pulse (!) 57, temperature 98.2 F (36.8 C), temperature source Oral, resp. rate 16, height 5\' 5"  (1.651 m), weight 119.8 kg, SpO2 97 %.   General: No acute distress, obese Mood and affect are appropriate Heart: Regular rate and rhythm no rubs murmurs or extra sounds Lungs: Clear to auscultation, breathing unlabored, no rales or wheezes Abdomen: Positive bowel sounds, soft nontender to palpation, nondistended Extremities: No clubbing, cyanosis, or edema Skin: No evidence of breakdown, no evidence of rash Neurologic: Cranial nerves II through XII intact, motor strength is 5/5 in bilateral deltoid, bicep, tricep, grip, 3- R and 5/5 left hip flexor, knee extensors, ankle dorsiflexor and plantar flexor  Musculoskeletal: Full range of motion in all 4 extremities. No joint swelling   Assessment/Plan: 1. Functional deficits which require 3+ hours per day of interdisciplinary therapy in a comprehensive inpatient rehab setting. Physiatrist is providing close team supervision and 24 hour management of active medical problems listed below. Physiatrist and rehab team continue to assess barriers to discharge/monitor patient progress toward functional and medical goals  Care  Tool:  Bathing              Bathing assist       Upper Body Dressing/Undressing Upper body dressing        Upper body assist      Lower Body Dressing/Undressing Lower body dressing            Lower body assist       Toileting Toileting    Toileting assist Assist for toileting: Minimal Assistance - Patient > 75%     Transfers Chair/bed transfer  Transfers assist           Locomotion Ambulation   Ambulation assist              Walk 10 feet activity   Assist           Walk 50 feet activity   Assist           Walk 150 feet activity   Assist           Walk 10 feet on uneven surface  activity   Assist           Wheelchair  Assist               Wheelchair 50 feet with 2 turns activity    Assist            Wheelchair 150 feet activity     Assist          Blood pressure 135/64, pulse (!) 57, temperature 98.2 F (36.8 C), temperature source Oral, resp. rate 16, height 5\' 5"  (1.651 m), weight 119.8 kg, SpO2 97 %.  Medical Problem List and Plan: 1.  Right side weakness functional deficits secondary to scattered acute infarct parasagittal left frontal parietal lobe much greater then right frontal lobe representing ACA/MCA watershed infarcts             -patient may shower             -ELOS/Goals: 10-14 days modI 2.  Impaired mobility, ambulating 20 feet: continue lovenox.             -antiplatelet therapy: Aspirin 81 mg daily and Plavix 75 mg day x3 weeks total then aspirin alone 3. Pain Management: Neurontin 300 mg daily 4. Mood: Trazodone 50 mg nightly, Cymbalta 30 mg daily Xanax as needed             -antipsychotic agents: N/A 5. Neuropsych: This patient is capable of making decisions on her own behalf. 6. Skin/Wound Care: Routine skin checks 7. Fluids/Electrolytes/Nutrition: Routine in and outs with follow-up chemistries 8.  Hypertension.  HCTZ 12.5 mg daily, Norvasc 10 mg daily,  Inderal 10 mg twice daily.  Monitor with increased mobility 9.  History of AVM with surgery 2015.  Follow-up outpatient 10.  Seizure disorder.  Continue Lamictal 100 mg nightly 11.  Hyperlipidemia.  Lipitor 12.  Tobacco abuse.  Provide counseling 13.  Obesity.  BMI 43.93.  Dietary follow-up 14.Insomnia/constipation/hypertension/anxiety: start magnesium gluconate 250mg   HS  15.  Epigastric discomfort with swallowing start protonix and monitor   LOS: 1 days A FACE TO FACE EVALUATION WAS PERFORMED  2016 11/27/2021, 7:59 AM

## 2021-11-27 NOTE — Evaluation (Signed)
Physical Therapy Assessment and Plan  Patient Details  Name: Kristin Burgess MRN: [id number] Date of Birth: 1961-05-15  PT Diagnosis: Abnormal posture, Abnormality of gait, Ataxia, Ataxic gait, Coordination disorder, Hemiplegia dominant, Impaired sensation, and Muscle weakness Rehab Potential: Good ELOS: 14-18 days   Today's Date: 11/27/2021 PT Individual Time: 1530-1615 1300-1400 PT Individual Time Calculation (min): 45 min  and 60 min   Hospital Problem: Principal Problem:   Acute bilat watershed infarction [facility])   Past Medical History:  Past Medical History:  Diagnosis Date   AVM (arteriovenous malformation) brain    Brain bleed ([place])    Depression    Hyperlipidemia    Hypertension    Past Surgical History:  Past Surgical History:  Procedure Laterality Date   BRAIN SURGERY  2015   TUBAL LIGATION      Assessment & Plan Clinical Impression: Patient is a 60 year old right-handed female with history of hypertension, tobacco use, AVM, with surgery 2015, seizure disorder maintained on Lamictal depression, morbid obesity BMI 39.93, .  Per chart review patient lives alone.  1 level home 5 steps to entry.  Modified independent for ambulation.  Family and friends assist with ADLs.  Presented to [facility] 11/22/2021 with progressive right leg weakness over an 8-day period as well as a recent fall when getting out of bed without loss of conscious.  She denied any lightheadedness or syncope.  Cranial CT scan showed subtle hypodensity of the deep white matter of the left parietal lobe consistent with infarction although age-indeterminate.  Unchanged nonacute encephalomalacia of the right parietal vertex.  CT angiogram head and neck no large vessel occlusion or significant stenosis.  No evidence of recurrent AVM.  MRI scattered small acute infarcts in the parasagittal left frontal parietal lobe much greater than right frontal lobe representing ACA/MCA watershed infarcts.  Patient did not receive tPA.   Echocardiogram with ejection fraction of 60 to 65% no wall motion abnormalities grade 1 diastolic dysfunction.  Admission chemistries unremarkable glucose 107, alcohol negative, hemoglobin A1c 5.9.  Neurology follow-up currently maintained on aspirin 81 mg daily and Plavix 75 mg daily for CVA prophylaxis x3 weeks then aspirin alone.  Subcutaneous Lovenox for DVT prophylaxis.  Tolerating a regular consistency diet.  Therapy evaluations completed due to patient's right side weakness and decreased functional mobility was admitted for a comprehensive rehab program. Patient transferred to CIR on 11/26/2021 .   Patient currently requires mod with mobility secondary to muscle weakness and muscle joint tightness, decreased cardiorespiratoy endurance, ataxia and decreased coordination, and decreased sitting balance, decreased standing balance, decreased postural control, hemiplegia, and decreased balance strategies.  Prior to hospitalization, patient was modified independent  with mobility and lived with Family (DC first to [employer] house with 24/7 S) in a House home.  Home access is 4Stairs to enter.  Patient will benefit from skilled PT intervention to maximize safe functional mobility, minimize fall risk, and decrease caregiver burden for planned discharge home with intermittent assist.  Anticipate patient will benefit from follow up [facility] at discharge.  PT - End of Session Activity Tolerance: Tolerates < 10 min activity, no significant change in vital signs Endurance Deficit: Yes Endurance Deficit Description: reports significant fatigue post ADL PT Assessment Rehab Potential (ACUTE/IP ONLY): Good PT Barriers to Discharge: [place] home environment;Decreased caregiver support;Home environment access/layout;Lack of/limited family support;Weight;Medication compliance;Behavior PT Patient demonstrates impairments in the following area(s): Balance;Behavior;Edema;Endurance;Motor;Pain;Perception;Safety;Sensory PT  Transfers Functional Problem(s): Bed Mobility;Bed to Chair;Car;Furniture PT Locomotion Functional Problem(s): Ambulation;Wheelchair Mobility;Stairs PT Plan PT Intensity: Minimum  of 1-2 x/day ,45 to 90 minutes PT Frequency: 5 out of 7 days PT Duration Estimated Length of Stay: 14-18 days PT Treatment/Interventions: Balance/vestibular training;Cognitive remediation/compensation;Ambulation/gait training;Community reintegration;DME/adaptive equipment instruction;Neuromuscular re-education;Psychosocial support;Stair training;UE/LE Strength taining/ROM;Wheelchair propulsion/positioning;Discharge planning;Functional electrical stimulation;Pain management;Skin care/wound management;Therapeutic Activities;UE/LE Coordination activities;Disease management/prevention;Functional mobility training;Patient/family education;Splinting/orthotics;Therapeutic Exercise;Visual/perceptual remediation/compensation PT Transfers Anticipated Outcome(s): Supervision assist with LRAD PT Locomotion Anticipated Outcome(s): supervision assist with LRAD ambulatory at house hold level PT Recommendation Recommendations for Other Services: Therapeutic Recreation consult Therapeutic Recreation Interventions: Stress management;Outing/community reintergration Follow Up Recommendations: Home health PT Patient destination: Home Equipment Recommended: To be determined;Rolling walker with 5" wheels;Wheelchair cushion (measurements);Wheelchair (measurements)   PT Evaluation Precautions/Restrictions Precautions Precautions: Fall Precaution Comments: mild L hemi, chronic RLE pain Restrictions Weight Bearing Restrictions: No General PT Amount of Missed Time (min): 15 Minutes PT Missed Treatment Reason: Patient fatigue Vital SignsTherapy Vitals Temp: 98.1 F (36.7 C) Pulse Rate: 65 Resp: 18 BP: 126/61 Patient Position (if appropriate): Sitting Oxygen Therapy SpO2: 98 % O2 Device: Room Air Pain Session 1.  pain  Assessment Pain Scale: 0-10 Pain Score: 05/10 RLE. RN aware. See MAR  Session 2 Pain Assessment Pain Scale: 0-10 Pain Score: 0-No pain Session 3.  pain Assessment Pain Scale: 0-10 Pain Score: 05/10 RLE. RN aware. See MAR  Pain Interference Pain Interference Pain Effect on Sleep: 2. Occasionally Pain Interference with Therapy Activities: 4. Almost constantly Pain Interference with Day-to-Day Activities: 4. Almost constantly Home Living/Prior Functioning Home Living Available Help at Discharge: Family;Available PRN/intermittently;Friend(s) Type of Home: House Home Access: Stairs to enter [employer] of Steps: 4 Entrance Stairs-Rails: Right (states it is wobbly and not safe to use) Home Layout: Two level Alternate Level Stairs-Number of Steps: able to live on the Main level Bathroom Shower/Tub: Chiropodist: Standard Bathroom Accessibility: Yes Additional Comments: Does not have shower chair - sits on side of tub during shower due to decreased standing endurance. Uses Rollator (>1 year) due to decreased walking endurance, but reports that it is broken  Lives With: Family (DC first to [employer] house with 24/7 S) Prior Function Level of Independence: Requires assistive device for independence;Needs assistance with ADLs Bath: Minimal  Able to Take Stairs?: Yes Driving: No Vision/Perception  Vision - History Ability to See in Adequate Light: 1 Impaired Vision - Assessment Eye Alignment: Within Functional Limits Ocular Range of Motion: Within Functional Limits Saccades: Decreased speed of saccadic movement Perception Perception: Within Functional Limits Praxis Praxis: Intact  Cognition Overall Cognitive Status: No family/caregiver present to determine baseline cognitive functioning Arousal/Alertness: Awake/alert Year: 2022 Month: December Day of Week: Correct Memory: Appears intact Immediate Memory Recall: Sock;Blue;Bed Memory Recall Sock:  Without Cue Memory Recall Blue: With Cue Memory Recall Bed: Without Cue Problem Solving: Impaired Safety/Judgment: Impaired Comments: mildly impulsive with transfers Sensation Sensation Light Touch: Impaired Detail Light Touch Impaired Details: Impaired RLE Hot/Cold: Appears Intact Proprioception: Impaired Detail Proprioception Impaired Details: Impaired RLE Additional Comments: mild proprioceptive deficits on the R sided with parasthesia Coordination Gross Motor Movements are Fluid and Coordinated: No Fine Motor Movements are Fluid and Coordinated: No Coordination and Movement Description: limtied by nerve pain on the RLE. ataxia Motor  Motor Motor: Hemiplegia;Ataxia Motor - Skilled Clinical Observations: R sided Hemiplegia LE>UE, mild LUE weakness   Trunk/Postural Assessment  Cervical Assessment Cervical Assessment: Exceptions to [facility] (forward head) Thoracic Assessment Thoracic Assessment: Exceptions to [facility] (rounded shoulders) Lumbar Assessment Lumbar Assessment: Exceptions to [facility] (posterior pelvic tilt) Postural Control Postural Control: Deficits on evaluation (heavy reliance  on BUE support for standing)  Balance Balance Balance Assessed: Yes Static Sitting Balance Static Sitting - Balance Support: Feet supported;Right upper extremity supported Static Sitting - Level of Assistance: 5: Stand by assistance Dynamic Sitting Balance Dynamic Sitting - Balance Support: Feet supported Dynamic Sitting - Level of Assistance: 4: Min assist Static Standing Balance Static Standing - Balance Support: Bilateral upper extremity supported;During functional activity Static Standing - Level of Assistance: 4: Min assist Dynamic Standing Balance Dynamic Standing - Level of Assistance: 3: Mod assist;4: Min assist Extremity Assessment  RUE Assessment RUE Assessment: Within Functional Limits General Strength Comments: 5/5 in shoulder flexion LUE Assessment LUE Assessment: Within  Functional Limits General Strength Comments: grossly WFL for ADL, 3+/5 in shoulder flexion RLE Assessment RLE Assessment: Exceptions to [facility] General Strength Comments: 2/5 through MMT due to neuropathic pain. functionally at least 4/5 LLE Assessment LLE Assessment: Within Functional Limits General Strength Comments: grossly 4+/5  Care Tool Care Tool Bed Mobility Roll left and right activity   Roll left and right assist level: Moderate Assistance - Patient 50 - 74%    Sit to lying activity   Sit to lying assist level: Moderate Assistance - Patient 50 - 74%    Lying to sitting on side of bed activity   Lying to sitting on side of bed assist level: the ability to move from lying on the back to sitting on the side of the bed with no back support.: Minimal Assistance - Patient > 75%     Care Tool Transfers Sit to stand transfer   Sit to stand assist level: Moderate Assistance - Patient 50 - 74%    Chair/bed transfer   Chair/bed transfer assist level: Moderate Assistance - Patient 50 - 74%     Psychologist, counselling transfer activity did not occur: Safety/medical concerns        Care Tool Locomotion Ambulation   Assist level: Maximal Assistance - Patient 25 - 49% Assistive device: Hand held assist Max distance: 5  Walk 10 feet activity   Assist level: Maximal Assistance - Patient 25 - 49% Assistive device: Hand held assist   Walk 50 feet with 2 turns activity Walk 50 feet with 2 turns activity did not occur: Safety/medical concerns      Walk 150 feet activity Walk 150 feet activity did not occur: Safety/medical concerns      Walk 10 feet on uneven surfaces activity Walk 10 feet on uneven surfaces activity did not occur: Safety/medical concerns      Stairs Stair activity did not occur: Safety/medical concerns        Walk up/down 1 step activity Walk up/down 1 step or curb (drop down) activity did not occur: Safety/medical concerns      Walk up/down  4 steps activity Walk up/down 4 steps activity did not occur: Safety/medical concerns      Walk up/down 12 steps activity Walk up/down 12 steps activity did not occur: Safety/medical concerns      Pick up small objects from floor Pick up small object from the floor (from standing position) activity did not occur: Safety/medical concerns      Wheelchair Is the patient using a wheelchair?: Yes Type of Wheelchair: Manual   Wheelchair assist level: Minimal Assistance - Patient > 75% Max wheelchair distance: 140f  Wheel 50 feet with 2 turns activity   Assist Level: Minimal Assistance - Patient > 75%  Wheel 150 feet activity  Assist Level: Moderate Assistance - Patient 50 - 74%    Refer to Care Plan for Long Term Goals  SHORT TERM GOAL WEEK 1 PT Short Term Goal 1 (Week 1): Pt will perform bed mobiltiy with min assist consistnetly PT Short Term Goal 2 (Week 1): Pt will transfer to [facility] with min assist and LRAD PT Short Term Goal 3 (Week 1): Pt will ambulate 35f with  min assist and LRAD PT Short Term Goal 4 (Week 1): Pt will ascend 4 steps with BUE support and mod assist  Recommendations for other services: Therapeutic Recreation  Stress management and Outing/community reintegration  Skilled Therapeutic Intervention Mobility Transfers Transfers: Sit to Stand;Stand Pivot Transfers;Stand to Sit Sit to Stand: Moderate Assistance - Patient 50-74% Stand to Sit: Minimal Assistance - Patient > 75% Stand Pivot Transfers: Moderate Assistance - Patient 50 - 74% Stand Pivot Transfer Details: Verbal cues for technique;Verbal cues for precautions/safety;Verbal cues for safe use of DME/AE;Tactile cues for posture Transfer (Assistive device): Rolling walker Locomotion  Gait Ambulation: Yes Gait Assistance: Moderate Assistance - Patient 50-74% Gait Distance (Feet): 10 Feet Assistive device: Rolling walker Gait Assistance Details: Verbal cues for gait pattern;Verbal cues for technique;Verbal  cues for sequencing;Verbal cues for precautions/safety;Verbal cues for safe use of DME/AE;Manual facilitation for weight shifting Gait Gait: Yes Gait Pattern: Impaired Gait Pattern: Ataxic;Antalgic;Wide base of support Stairs / Additional Locomotion Stairs: Yes Stairs Assistance: Moderate Assistance - Patient 50 - 74% Stair Management Technique: Two rails Number of Stairs: 2 Height of Stairs: 3 Wheelchair Mobility Wheelchair Mobility: Yes Wheelchair Assistance: Minimal assistance - Patient >75% Wheelchair Propulsion: Both upper extremities Wheelchair Parts Management: Needs assistance Distance: 100   Session 1.  Pt received sitting in WC and agreeable to PT. PT instructed patient in PT Evaluation and initiated treatment intervention; see above for results. PT educated patient in POpp rehab potential, rehab goals, and discharge recommendations along with recommendation for follow-up rehabilitation services. [facility]. Gait training with RW x 238fas listed. Pain in WB through the RLE. Stair management on 3 inch step with mod assits and mod cues for step to pattern. WC mobility as listed above. Patient returned to room and performed stand pivot to recliner with mod assist. Pt left sitting in recliner with call bell in reach and all needs met.    Session 2.   Pt received sitting in recliner and agreeable to PT. Stand pivot transfer with min assits and increased time due to pain and coordination deficits. Pt performed car transfer with mod-max assist and RW. Pain reported in R LE with hip/knee flexion into car. Sit<>stand x 5 with mod assist and cues for proper LE placement. Pt returned to room and performed ** transfer to bed with mod assist. Sit>supine completed with with cues for improved BLE position and improve coordination through the RLE, and left supine in bed with call bell in reach and all needs met.   Session 3.  Pt  declined therapy due to fatigue. PT encouraged physical activity, bur pt requesting to eat dinner and perform additional therapy tomorrow. Left in bed with call bell in reach and all needs met.     Discharge Criteria: Patient will be discharged from PT if patient refuses treatment 3 consecutive times without medical reason, if treatment goals not met, if there is a change in medical status, if patient makes no progress towards goals or if patient is discharged from  hospital.  The above assessment, treatment plan, treatment alternatives and goals were discussed and mutually agreed upon: by patient  Lorie Phenix 11/27/2021, 5:33 PM

## 2021-11-27 NOTE — Plan of Care (Signed)
  Problem: RH Balance Goal: LTG Patient will maintain dynamic standing with ADLs (OT) Description: LTG:  Patient will maintain dynamic standing balance with assist during activities of daily living (OT)  Flowsheets (Taken 11/27/2021 1509) LTG: Pt will maintain dynamic standing balance during ADLs with: Supervision/Verbal cueing   Problem: Sit to Stand Goal: LTG:  Patient will perform sit to stand in prep for activites of daily living with assistance level (OT) Description: LTG:  Patient will perform sit to stand in prep for activites of daily living with assistance level (OT) Flowsheets (Taken 11/27/2021 1509) LTG: PT will perform sit to stand in prep for activites of daily living with assistance level: Supervision/Verbal cueing   Problem: RH Grooming Goal: LTG Patient will perform grooming w/assist,cues/equip (OT) Description: LTG: Patient will perform grooming with assist, with/without cues using equipment (OT) Flowsheets (Taken 11/27/2021 1509) LTG: Pt will perform grooming with assistance level of: Independent with assistive device    Problem: RH Bathing Goal: LTG Patient will bathe all body parts with assist levels (OT) Description: LTG: Patient will bathe all body parts with assist levels (OT) Flowsheets (Taken 11/27/2021 1509) LTG: Pt will perform bathing with assistance level/cueing: Minimal Assistance - Patient > 75%   Problem: RH Dressing Goal: LTG Patient will perform upper body dressing (OT) Description: LTG Patient will perform upper body dressing with assist, with/without cues (OT). Flowsheets (Taken 11/27/2021 1509) LTG: Pt will perform upper body dressing with assistance level of: Independent with assistive device Goal: LTG Patient will perform lower body dressing w/assist (OT) Description: LTG: Patient will perform lower body dressing with assist, with/without cues in positioning using equipment (OT) Flowsheets (Taken 11/27/2021 1509) LTG: Pt will perform lower body  dressing with assistance level of: Minimal Assistance - Patient > 75%   Problem: RH Toileting Goal: LTG Patient will perform toileting task (3/3 steps) with assistance level (OT) Description: LTG: Patient will perform toileting task (3/3 steps) with assistance level (OT)  Flowsheets (Taken 11/27/2021 1509) LTG: Pt will perform toileting task (3/3 steps) with assistance level: Minimal Assistance - Patient > 75%   Problem: RH Functional Use of Upper Extremity Goal: LTG Patient will use RT/LT upper extremity as a (OT) Description: LTG: Patient will use right/left upper extremity as a stabilizer/gross assist/diminished/nondominant/dominant level with assist, with/without cues during functional activity (OT) Flowsheets (Taken 11/27/2021 1509) LTG: Use of upper extremity in functional activities: LUE as nondominant level LTG: Pt will use upper extremity in functional activity with assistance level of: Independent with assistive device   Problem: RH Toilet Transfers Goal: LTG Patient will perform toilet transfers w/assist (OT) Description: LTG: Patient will perform toilet transfers with assist, with/without cues using equipment (OT) Flowsheets (Taken 11/27/2021 1509) LTG: Pt will perform toilet transfers with assistance level of: Supervision/Verbal cueing   Problem: RH Tub/Shower Transfers Goal: LTG Patient will perform tub/shower transfers w/assist (OT) Description: LTG: Patient will perform tub/shower transfers with assist, with/without cues using equipment (OT) Flowsheets (Taken 11/27/2021 1509) LTG: Pt will perform tub/shower stall transfers with assistance level of: Minimal Assistance - Patient > 75%   Problem: RH Awareness Goal: LTG: Patient will demonstrate awareness during functional activites type of (OT) Description: LTG: Patient will demonstrate awareness during functional activites type of (OT) Flowsheets (Taken 11/27/2021 1509) Patient will demonstrate awareness during functional  activites type of: Anticipatory LTG: Patient will demonstrate awareness during functional activites type of (OT): Minimal Assistance - Patient > 75%

## 2021-11-27 NOTE — Progress Notes (Signed)
Patient ID: Kristin Burgess, female   DOB: Oct 18, 1961, 60 y.o.   MRN: [id number] Met with the patient to introduce self and role. Reviewed therapy schedule, rehab team conference and plan of care. Discussed secondary risk factors, dietary modification recommendations and smoking cessation. Reviewed HTN, HLD (LDL 87) and prediabetes (A1C 5.9). continue to follow along to discharge to address educational needs and facilitate preparation for discharge. Margarito Liner

## 2021-11-27 NOTE — Progress Notes (Signed)
PROGRESS NOTE   Subjective/Complaints:  No issues overnite except epigastric pain with swallowing either solids or liquids, no issues at home with this   ROS- neg SOB, N/V/D   Objective:   No results found. Recent Labs    11/26/21 0341 11/26/21 1458  WBC 3.6* 4.3  HGB 12.8 13.4  HCT 42.2 43.5  PLT 238 263   Recent Labs    11/25/21 0448 11/26/21 0341 11/26/21 1458  NA 140 139  --   K 3.9 3.9  --   CL 107 105  --   CO2 26 25  --   GLUCOSE 95 96  --   BUN 16 21*  --   CREATININE 0.71 0.73 0.90  CALCIUM 9.2 9.2  --     Intake/Output Summary (Last 24 hours) at 11/27/2021 0750 Last data filed at 11/26/2021 1839 Gross per 24 hour  Intake 240 ml  Output --  Net 240 ml        Physical Exam: Vital Signs Blood pressure 135/64, pulse (!) 57, temperature 98.2 F (36.8 C), temperature source Oral, resp. rate 16, height 5\' 5"  (1.651 m), weight 119.8 kg, SpO2 97 %.   General: No acute distress Mood and affect are appropriate Heart: Regular rate and rhythm no rubs murmurs or extra sounds Lungs: Clear to auscultation, breathing unlabored, no rales or wheezes Abdomen: Positive bowel sounds, epigastric tenderness to palpation, nondistended Extremities: No clubbing, cyanosis, or edema Skin: No evidence of breakdown, no evidence of rash Neurologic: Cranial nerves II through XII intact, motor strength is 5/5 in bilateral deltoid, bicep, tricep, grip, hip flexor, knee extensors, ankle dorsiflexor and plantar flexor Sensory exam normal sensation to light touch and proprioception in bilateral upper and lower extremities Cerebellar exam normal finger to nose to finger as well as heel to shin in bilateral upper and lower extremities Musculoskeletal: Full range of motion in all 4 extremities. No joint swelling   Assessment/Plan: 1. Functional deficits which require 3+ hours per day of interdisciplinary therapy in a  comprehensive inpatient rehab setting. Physiatrist is providing close team supervision and 24 hour management of active medical problems listed below. Physiatrist and rehab team continue to assess barriers to discharge/monitor patient progress toward functional and medical goals  Care Tool:  Bathing              Bathing assist       Upper Body Dressing/Undressing Upper body dressing        Upper body assist      Lower Body Dressing/Undressing Lower body dressing            Lower body assist       Toileting Toileting    Toileting assist Assist for toileting: Minimal Assistance - Patient > 75%     Transfers Chair/bed transfer  Transfers assist           Locomotion Ambulation   Ambulation assist              Walk 10 feet activity   Assist           Walk 50 feet activity   Assist  Walk 150 feet activity   Assist           Walk 10 feet on uneven surface  activity   Assist           Wheelchair     Assist               Wheelchair 50 feet with 2 turns activity    Assist            Wheelchair 150 feet activity     Assist          Blood pressure 135/64, pulse (!) 57, temperature 98.2 F (36.8 C), temperature source Oral, resp. rate 16, height 5\' 5"  (1.651 m), weight 119.8 kg, SpO2 97 %.  Medical Problem List and Plan: 1.  Right side weakness functional deficits secondary to scattered acute infarct parasagittal left frontal parietal lobe much greater then right frontal lobe representing ACA/MCA watershed infarcts             -patient may shower             -ELOS/Goals: 10-14 days modI 2.  Impaired mobility, ambulating 20 feet: continue lovenox.             -antiplatelet therapy: Aspirin 81 mg daily and Plavix 75 mg day x3 weeks total then aspirin alone 3. Pain Management: Neurontin 300 mg daily 4. Mood: Trazodone 50 mg nightly, Cymbalta 30 mg daily Xanax as needed              -antipsychotic agents: N/A 5. Neuropsych: This patient is capable of making decisions on her own behalf. 6. Skin/Wound Care: Routine skin checks 7. Fluids/Electrolytes/Nutrition: Routine in and outs with follow-up chemistries 8.  Hypertension.  HCTZ 12.5 mg daily, Norvasc 10 mg daily, Inderal 10 mg twice daily.  Monitor with increased mobility 9.  History of AVM with surgery 2015.  Follow-up outpatient 10.  Seizure disorder.  Continue Lamictal 100 mg nightly 11.  Hyperlipidemia.  Lipitor 12.  Tobacco abuse.  Provide counseling 13.  Obesity.  BMI 43.93.  Dietary follow-up 14.Insomnia/constipation/hypertension/anxiety: start magnesium gluconate 250mg   HS    LOS: 1 days A FACE TO FACE EVALUATION WAS PERFORMED  2016 11/27/2021, 7:50 AM

## 2021-11-27 NOTE — Evaluation (Signed)
Occupational Therapy Assessment and Plan  Patient Details  Name: Kristin Burgess MRN: [id number] Date of Birth: 1961-06-14  OT Diagnosis: abnormal posture, acute pain, disturbance of vision, hemiplegia affecting non-dominant side, muscle weakness (generalized), and decreased activity tolerance, dynamic standing balance, postural control Rehab Potential: Rehab Potential (ACUTE ONLY): Fair ELOS: 2 - 2.5 weeks   Today's Date: 11/27/2021 OT Individual Time: 1914-7829 OT Individual Time Calculation (min): 53 min     Hospital Problem: Principal Problem:   Acute bilat watershed infarction [facility])   Past Medical History:  Past Medical History:  Diagnosis Date   AVM (arteriovenous malformation) brain    Brain bleed ([place])    Depression    Hyperlipidemia    Hypertension    Past Surgical History:  Past Surgical History:  Procedure Laterality Date   BRAIN SURGERY  2015   TUBAL LIGATION      Assessment & Plan Clinical Impression: Patient is a 60 y.o. year old female with history of hypertension, tobacco use, AVM, with surgery 2015, seizure disorder maintained on Lamictal depression, morbid obesity BMI 39.93, .  Per chart review patient lives alone.  1 level home 5 steps to entry.  Modified independent for ambulation.  Family and friends assist with ADLs.  Presented to [facility] 11/22/2021 with progressive right leg weakness over an 8-day period as well as a recent fall when getting out of bed without loss of conscious.  She denied any lightheadedness or syncope.  Cranial CT scan showed subtle hypodensity of the deep white matter of the left parietal lobe consistent with infarction although age-indeterminate.  Unchanged nonacute encephalomalacia of the right parietal vertex.  CT angiogram head and neck no large vessel occlusion or significant stenosis.  No evidence of recurrent AVM.  MRI scattered small acute infarcts in the parasagittal left frontal parietal lobe much greater than right frontal lobe  representing ACA/MCA watershed infarcts.  Patient did not receive tPA.  Echocardiogram with ejection fraction of 60 to 65% no wall motion abnormalities grade 1 diastolic dysfunction.  Admission chemistries unremarkable glucose 107, alcohol negative, hemoglobin A1c 5.9.  Neurology follow-up currently maintained on aspirin 81 mg daily and Plavix 75 mg daily for CVA prophylaxis x3 weeks then aspirin alone.  Subcutaneous Lovenox for DVT prophylaxis.  Tolerating a regular consistency diet.  Therapy evaluations completed due to patient's right side weakness and decreased functional mobility was admitted for a comprehensive rehab program. Currently complains of constipation..  Patient transferred to CIR on 11/26/2021 .    Patient currently requires mod to max with basic self-care skills secondary to muscle weakness, decreased cardiorespiratoy endurance, impaired timing and sequencing, unbalanced muscle activation, decreased coordination, and decreased motor planning, field cut, decreased attention to left and decreased motor planning, decreased attention and delayed processing, and decreased sitting balance, decreased standing balance, decreased postural control, hemiplegia, decreased balance strategies, and pain .  Prior to hospitalization, patient could complete BADL with min.  Patient will benefit from skilled intervention to decrease level of assist with basic self-care skills and increase independence with basic self-care skills prior to discharge home with care partner.  Anticipate patient will require intermittent supervision and minimal physical assistance and follow up home health.  OT - End of Session Activity Tolerance: Tolerates 10 - 20 min activity with multiple rests Endurance Deficit: Yes Endurance Deficit Description: reports significant fatigue post ADL OT Assessment Rehab Potential (ACUTE ONLY): Fair OT Barriers to Discharge: Inaccessible home environment;Home environment  access/layout;Decreased caregiver support OT Patient demonstrates impairments in the following  area(s): Balance;Cognition;Endurance;Motor;Pain;Safety;Vision;Skin Integrity;Sensory OT Basic ADL's Functional Problem(s): Grooming;Bathing;Dressing;Toileting;Eating OT Transfers Functional Problem(s): Toilet;Tub/Shower OT Additional Impairment(s): Fuctional Use of Upper Extremity OT Plan OT Intensity: Minimum of 1-2 x/day, 45 to 90 minutes OT Frequency: 5 out of 7 days OT Duration/Estimated Length of Stay: 2 - 2.5 weeks OT Treatment/Interventions: Balance/vestibular training;Disease mangement/prevention;Neuromuscular re-education;Self Care/advanced ADL retraining;Therapeutic Exercise;Wheelchair propulsion/positioning;UE/LE Strength taining/ROM;Skin care/wound managment;Pain management;DME/adaptive equipment instruction;Cognitive remediation/compensation;Community reintegration;Functional electrical stimulation;Patient/family education;UE/LE Coordination activities;Visual/perceptual remediation/compensation;Therapeutic Activities;Splinting/orthotics;Psychosocial support;Functional mobility training;Discharge planning OT Self Feeding Anticipated Outcome(s): mod I OT Basic Self-Care Anticipated Outcome(s): min A OT Toileting Anticipated Outcome(s): min A OT Bathroom Transfers Anticipated Outcome(s): S OT Recommendation Recommendations for Other Services: Therapeutic Recreation consult Therapeutic Recreation Interventions: Pet therapy;Kitchen group;Outing/community reintergration;Stress management Patient destination: Home Follow Up Recommendations: Home health OT Equipment Recommended: To be determined   OT Evaluation Precautions/Restrictions  Restrictions Weight Bearing Restrictions: No  Pain Pain Assessment Pain Scale: 0-10 Pain Score: 0-No pain Home Living/Prior Functioning Home Living Family/patient expects to be discharged to:: Private residence Living Arrangements: Spouse/significant  other Available Help at Discharge: Family, Available PRN/intermittently, Friend(s) Type of Home: House Home Access: Stairs to enter Technical brewer of Steps: 4 Entrance Stairs-Rails: Right (states it is wobbly and not safe to use) Home Layout: Two level Alternate Level Stairs-Number of Steps: able to live on the Main level Bathroom Shower/Tub: Chiropodist: Standard Bathroom Accessibility: Yes Additional Comments: Does not have shower chair - sits on side of tub during shower due to decreased standing endurance. 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 Vision Baseline Vision/History: 1 Wears glasses Ability to See in Adequate Light: 1 Impaired Patient Visual Report: Blurring of vision Vision Assessment?: Yes Eye Alignment: Within Functional Limits Ocular Range of Motion: Within Functional Limits Saccades: Decreased speed of saccadic movement Visual Fields: No apparent deficits Perception  Perception: Within Functional Limits Praxis Praxis: Intact Cognition Overall Cognitive Status: No family/caregiver present to determine baseline cognitive functioning Arousal/Alertness: Awake/alert Orientation Level: Person;Place;Situation Person: Oriented Place: Oriented Situation: Oriented Year: 2022 Month: December Day of Week: Correct Memory: Appears intact Immediate Memory Recall: Sock;Blue;Bed Memory Recall Sock: Without Cue Memory Recall Blue: With Cue Memory Recall Bed: Without Cue Awareness:  Impaired Problem Solving: Impaired Safety/Judgment: Impaired Comments: mildly impulsive with transfers Sensation Sensation Light Touch: Impaired Detail Light Touch Impaired Details: Impaired RLE Hot/Cold: Appears Intact Proprioception: Impaired Detail Proprioception Impaired Details: Impaired RLE Additional Comments: mild proprioceptive deficits on the R sided with parasthesia Coordination Gross Motor Movements are Fluid and Coordinated: No Fine Motor Movements are Fluid and Coordinated: No Coordination and Movement Description: limtied by nerve pain on the RLE. ataxia Heel Shin Test: unable to perform on the RLE Motor  Motor Motor: Hemiplegia;Ataxia Motor - Skilled Clinical Observations: R sided Hemiplegia LE>UE, mild LUE weakness  Trunk/Postural Assessment  Cervical Assessment Cervical Assessment: Exceptions to [facility] (forward head) Thoracic Assessment Thoracic Assessment: Exceptions to [facility] (rounded shoulders) Lumbar Assessment Lumbar Assessment: Exceptions to [facility] (posterior pelvic tilt) Postural Control Postural Control: Deficits on evaluation (heavy reliance on BUE support for standing)  Balance Balance Balance Assessed: Yes Static Sitting Balance Static Sitting -  Balance Support: Feet supported;Right upper extremity supported Static Sitting - Level of Assistance: 5: Stand by assistance Dynamic Sitting Balance Dynamic Sitting - Balance Support: Feet supported Dynamic Sitting - Level of Assistance: 4: Min assist Static Standing Balance Static Standing - Balance Support: Bilateral upper extremity supported;During functional activity Static Standing - Level of Assistance: 4: Min assist Dynamic Standing Balance Dynamic Standing - Level of Assistance: 3: Mod assist;4: Min assist Extremity/Trunk Assessment RUE Assessment RUE Assessment: Within Functional Limits General Strength Comments: 5/5 in shoulder flexion LUE Assessment LUE Assessment: Within Functional  Limits General Strength Comments: grossly WFL for ADL, 3+/5 in shoulder flexion  Care Tool Care Tool Self Care Eating   Eating Assist Level: Set up assist    Oral Care    Oral Care Assist Level: Supervision/Verbal cueing (seated)    Bathing   Body parts bathed by patient: Right arm;Left arm;Chest;Abdomen;Front perineal area;Left upper leg;Right upper leg;Face Body parts bathed by helper: Buttocks;Right lower leg;Left lower leg   Assist Level: Moderate Assistance - Patient 50 - 74%    Upper Body Dressing(including orthotics)   What is the patient wearing?: Pull over shirt   Assist Level: Supervision/Verbal cueing    Lower Body Dressing (excluding footwear)   What is the patient wearing?: Underwear/pull up;Pants Assist for lower body dressing: Total Assistance - Patient < 25%    Putting on/Taking off footwear   What is the patient wearing?: Shoes;Socks Assist for footwear: Total Assistance - Patient < 25%       Care Tool Toileting Toileting activity   Assist for toileting: Total Assistance - Patient < 25%     Care Tool Bed Mobility Roll left and right activity    NA    Sit to lying activity    NA    Lying to sitting on side of bed activity      NA   Care Tool Transfers Sit to stand transfer   Sit to stand assist level: Moderate Assistance - Patient 50 - 74%    Chair/bed transfer   Chair/bed transfer assist level: Moderate Assistance - Patient 50 - 74%     Toilet transfer   Assist Level: Moderate Assistance - Patient 50 - 74%     Care Tool Cognition  Expression of Ideas and Wants Expression of Ideas and Wants: 3. Some difficulty - exhibits some difficulty with expressing needs and ideas (e.g, some words or finishing thoughts) or speech is not clear  Understanding Verbal and Non-Verbal Content Understanding Verbal and Non-Verbal Content: 3. Usually understands - understands most conversations, but misses some part/intent of message. Requires cues at times to  understand   Memory/Recall Ability Memory/Recall Ability : Current season;That he or she is in a hospital/hospital unit   Refer to Care Plan for [place] 1 OT Short Term Goal 1 (Week 1): Pt will don pants with max A and AE PRN. OT Short Term Goal 2 (Week 1): Pt will complete 1/3 steps of toileting with S. OT Short Term Goal 3 (Week 1): Pt will complete toilet transfer with min A + LRAD.  Recommendations for other services: Therapeutic Recreation  Pet therapy, Kitchen group, Stress management, and Outing/community reintegration   Skilled Therapeutic Intervention ADL ADL Eating: Set up Where Assessed-Eating: Bed level;Chair Grooming: Supervision/safety Where Assessed-Grooming: Sitting at sink Upper Body Bathing: Supervision/safety Where Assessed-Upper Body Bathing: Sitting at sink Lower Body Bathing: Moderate assistance Where Assessed-Lower Body Bathing: Standing at sink Upper Body Dressing: Supervision/safety  Where Assessed-Upper Body Dressing: Sitting at sink Lower Body Dressing: Maximal assistance Where Assessed-Lower Body Dressing: Standing at sink Toileting: Moderate assistance Where Assessed-Toileting: Bedside Commode Toilet Transfer: Moderate assistance Toilet Transfer Method: Stand pivot Science writer: Radiographer, therapeutic: Not assessed Social research officer, government: Not assessed Mobility  Transfers Sit to Stand: Moderate Assistance - Patient 50-74% Stand to Sit: Minimal Assistance - Patient > 75%  Session Note: Pt received seated EOB, c/o ongoing RLE pain, but did not rate and agreeable to OT eval.  Reviewed role of CIR OT, evaluation process, ADL/func mobility retraining, goals for therapy, and safety plan. Evaluation completed as documented above. Stand-pivot x2 throughout session with overall mod A to power up, to guide RW, and to block R knee due to pain. Completed UB bathing and dressing seated in w/c with S.  Completed LB bathing with mod A to bathe buttocks/feet. Sit to stand x4 throughout with use of sink and min A to block R knee, leaning L to avoid WB on RLE. Total A for LB dressing. Pt left seated in recliner with safety belt alarm engaged, call bell in reach, and all immediate needs met.   Discharge Criteria: Patient will be discharged from OT if patient refuses treatment 3 consecutive times without medical reason, if treatment goals not met, if there is a change in medical status, if patient makes no progress towards goals or if patient is discharged from hospital.  The above assessment, treatment plan, treatment alternatives and goals were discussed and mutually agreed upon: by patient  Volanda Napoleon MS, OTR/L  11/27/2021, 9:59 AM

## 2021-11-27 NOTE — Progress Notes (Signed)
Inpatient Rehabilitation Center Individual Statement of Services  Patient Name:  Kristin Burgess  Date:  11/27/2021  Welcome to the Inpatient Rehabilitation Center.  Our goal is to provide you with an individualized program based on your diagnosis and situation, designed to meet your specific needs.  With this comprehensive rehabilitation program, you will be expected to participate in at least 3 hours of rehabilitation therapies Monday-Friday, with modified therapy programming on the weekends.  Your rehabilitation program will include the following services:  Physical Therapy (PT), Occupational Therapy (OT), Speech Therapy (ST), 24 hour per day rehabilitation nursing, Therapeutic Recreaction (TR), Neuropsychology, Care Coordinator, Rehabilitation Medicine, Nutrition Services, Pharmacy Services, and Other  Weekly team conferences will be held on Wednesdays to discuss your progress.  Your Inpatient Rehabilitation Care Coordinator will talk with you frequently to get your input and to update you on team discussions.  Team conferences with you and your family in attendance may also be held.  Expected length of stay: 16-18 Days  Overall anticipated outcome:  MOD A  Depending on your progress and recovery, your program may change. Your Inpatient Rehabilitation Care Coordinator will coordinate services and will keep you informed of any changes. Your Inpatient Rehabilitation Care Coordinator's name and contact numbers are listed  below.  The following services may also be recommended but are not provided by the Inpatient Rehabilitation Center:   Home Health Rehabiltiation Services Outpatient Rehabilitation Services  Arrangements will be made to provide these services after discharge if needed.  Arrangements include referral to agencies that provide these services.  Your insurance has been verified to be:  Medicare Your primary doctor is:  Bynum, [place], MD  Pertinent information will be  shared with your doctor and your insurance company.  Inpatient Rehabilitation Care Coordinator:  Lavera Guise, [place] [phone] or [phone]  Information discussed with and copy given to patient by: Andria Rhein, 11/27/2021, 12:24 PM

## 2021-11-28 DIAGNOSIS — I6389 Other cerebral infarction: Secondary | ICD-10-CM | POA: Diagnosis not present

## 2021-11-28 NOTE — Progress Notes (Signed)
Physical Therapy Session Note  Patient Details  Name: Kristin Burgess MRN: [id number] Date of Birth: 10/10/1961  Today's Date: 11/28/2021 PT Individual Time: 1135-1200 and 2409-7353 PT Individual Time Calculation (min): 25 min  and 50 min   Short Term Goals: Week 1:  PT Short Term Goal 1 (Week 1): Pt will perform bed mobiltiy with min assist consistnetly PT Short Term Goal 2 (Week 1): Pt will transfer to Kristin Burgess with min assist and LRAD PT Short Term Goal 3 (Week 1): Pt will ambulate 27f with  min assist and LRAD PT Short Term Goal 4 (Week 1): Pt will ascend 4 steps with BUE support and mod assist  Skilled Therapeutic Interventions/Progress Updates:   Session 1.   Pt received sitting in WC and agreeable to PT. [place] [facility] [place] [facility] instructed pt in TUG: 4.5 minutes ( >13.5 sec indicates increased fall risk) min-mod assist throughout with mild trunkal ataxia, circumduction limb advancement on the R, and instruction for safe RW management in turns.  Sit<>stand transfers with min assist throughout session with cues for proper BLE position to prevent posterior LOB.  [facility] to [facility] and [facility] and all needs met.      Session 2.   Pt received sitting in EOB and agreeable to PT. Stand pivot [facility] without AD. Transported to bathroom, stand pivot transfer to toilet  with min assist and BUE supported on rail, pt able to manage clothing with supervision assist. [facility], [facility] to nustep with Min assist and RW. Reciprocal movement training x 7 minutes with cues for increased ROM as tolerated on the RLE, pt reports mild discomfort with knee flexion, but does not rate; level 2-4 with increased speed and ROM noted.  Gait training with RW x 265fwith min assist and cues for improved hip/knee flexion with RLE limb advancement. Pt noted to have decreased  trunkal ataxia and improved posture from prior PT treatment sessions.   Pt transported to parallel bars. Reciprocal foot taps on 4 inch step with min assist from PT for safety x 5 BLE. No LE instability noted on the R in stance as well as adequate ROM to place and remove on step. Step up/down on 4 inch step with step to pattern leading with the LLE x 5. Min assist for safety and cues for improved position of the RLE when returning to the floor.   Patient returned to room and [facility] reach and all needs met.        Therapy Documentation Precautions:  Precautions Precautions: Fall Precaution Comments: mild L hemi, chronic RLE pain Restrictions Weight Bearing Restrictions: No General: PT Amount of Missed Time (min): 10 Minutes PT Missed Treatment Reason: Patient fatigue Vital Signs: Therapy Vitals Temp: 98.4 F (36.9 C) Pulse Rate: 64 Resp: 18 BP: 132/74 Patient Position (if appropriate): Sitting Oxygen Therapy SpO2: 96 % O2 Device: Room Air Pain: Denies in both first and seconds session.     Therapy/Group: Individual Therapy  AuLorie Phenix2/08/2021, 5:58 PM

## 2021-11-28 NOTE — Progress Notes (Signed)
PMR Admission Coordinator Pre-Admission Assessment   Patient: Kristin Burgess is an 60 y.o., female MRN: [id number] DOB: Aug 19, 1961 Height: 5\' 5"  (165.1 cm) Weight: 119.7 kg   Insurance Information HMO:     PPO:      PCP:      IPA:      80/20: no     OTHER:  PRIMARY: Medicare       Policy#:   [id number]    Subscriber: Pt. Phone#: Verified online    Fax#:  Pre-Cert#:       Employer:  Benefits:  Phone #:      Name:  Eff. Date: Parts A ad B effective  Deduct: $1556      Out of Pocket Max:  None      Life Max: N/A  CIR: 100%      SNF: 100 days Outpatient: 80%     Co-Pay: 20% Home Health: 100%      Co-Pay: none DME: 80%     Co-Pay: 20% Providers: patient's choice SECONDARY: Medicaid of [place]      Policy#: [id number]     Phone#:  Financial Counselor:       Phone#:    The Therapist, art Information Summary" for patients in Inpatient Rehabilitation Facilities with attached "Privacy Act [place] Records" was provided and verbally reviewed with: Patient   Emergency Contact Information Contact Information       Name Relation Home Work Mobile    Picazo,Geraldine Mother [phone]               Current Medical History  Patient Admitting Diagnosis: CVA       History of Present Illness:  Kristin Burgess is a 60 year old right-handed female with history of hypertension, tobacco use, AVM, with surgery 2015, seizure disorder maintained on Lamictal depression, morbid obesity BMI 39.93, .  Per chart review patient lives alone.  1 level home 5 steps to entry.  Modified independent for ambulation.  Family and friends assist with ADLs.  Presented to [facility] 11/22/2021 with progressive right leg weakness over an 8-day period as well as a recent fall when getting out of bed without loss of conscious.  She denied any lightheadedness or syncope.  Cranial CT scan showed subtle hypodensity of the deep white matter of the left parietal lobe consistent with infarction although age-indeterminate.  Unchanged  nonacute encephalomalacia of the right parietal vertex.  CT angiogram head and neck no large vessel occlusion or significant stenosis.  No evidence of recurrent AVM.  MRI scattered small acute infarcts in the parasagittal left frontal parietal lobe much greater than right frontal lobe representing ACA/MCA watershed infarcts.  Patient did not receive tPA.  Echocardiogram with ejection fraction of 60 to 65% no wall motion abnormalities grade 1 diastolic dysfunction.  Admission chemistries unremarkable glucose 107, alcohol negative, hemoglobin A1c 5.9.  Neurology follow-up currently maintained on aspirin 81 mg daily and Plavix 75 mg daily for CVA prophylaxis x3 weeks then aspirin alone.  Subcutaneous Lovenox for DVT prophylaxis.  Tolerating a regular consistency diet.  Therapy evaluations completed due to patient's right side weakness and decreased functional mobility was admitted for a comprehensive rehab program.   Complete NIHSS TOTAL: 3   Patient's medical record from [facility] has been reviewed by the rehabilitation admission coordinator and physician.   Past Medical History         Past Medical History:  Diagnosis Date   AVM (arteriovenous malformation) brain     Brain bleed (HCC)  Depression     Hyperlipidemia     Hypertension        Has the patient had major surgery during 100 days prior to admission? No   Family History   family history includes Colon cancer in her maternal grandfather; Lung cancer in her maternal grandmother.   Current Medications   Current Facility-Administered Medications:    acetaminophen (TYLENOL) tablet 650 mg, 650 mg, Oral, Q4H PRN **OR** acetaminophen (TYLENOL) 160 MG/5ML solution 650 mg, 650 mg, Per Tube, Q4H PRN **OR** acetaminophen (TYLENOL) suppository 650 mg, 650 mg, Rectal, Q4H PRN, Clarnce Flock, MD   ALPRAZolam Duanne Moron) tablet 0.25 mg, 0.25 mg, Oral, BID PRN, Wyvonnia Dusky, MD, 0.25 mg at 11/26/21 [id number]   amLODipine  (NORVASC) tablet 10 mg, 10 mg, Oral, Daily, Clarnce Flock, MD, 10 mg at 11/25/21 0846   aspirin EC tablet 81 mg, 81 mg, Oral, Daily, Greta Doom, MD, 81 mg at 11/26/21 0939   atorvastatin (LIPITOR) tablet 80 mg, 80 mg, Oral, Daily, Greta Doom, MD, 80 mg at 11/26/21 0940   clopidogrel (PLAVIX) tablet 75 mg, 75 mg, Oral, Daily, Greta Doom, MD, 75 mg at 11/26/21 0939   darifenacin (ENABLEX) 24 hr tablet 15 mg, 15 mg, Oral, Daily, Clarnce Flock, MD, 15 mg at 11/26/21 0939   docusate sodium (COLACE) capsule 200 mg, 200 mg, Oral, BID, Wyvonnia Dusky, MD, 200 mg at 11/26/21 [id number]   DULoxetine (CYMBALTA) DR capsule 30 mg, 30 mg, Oral, Daily, Clarnce Flock, MD, 30 mg at 11/26/21 0940   enoxaparin (LOVENOX) injection 40 mg, 40 mg, Subcutaneous, Q24H, Clarnce Flock, MD, 40 mg at 11/25/21 2158   gabapentin (NEURONTIN) capsule 300 mg, 300 mg, Oral, QPM, Clarnce Flock, MD, 300 mg at 11/25/21 1754   hydrALAZINE (APRESOLINE) injection 20 mg, 20 mg, Intravenous, Q8H PRN, Wyvonnia Dusky, MD, 20 mg at 11/23/21 1526   hydrochlorothiazide (HYDRODIURIL) tablet 12.5 mg, 12.5 mg, Oral, Daily, Clarnce Flock, MD, 12.5 mg at 11/25/21 [id number]   lamoTRIgine (LAMICTAL XR) 24 hour tablet 100 mg, 100 mg, Oral, QHS, Clarnce Flock, MD, 100 mg at 11/25/21 2157   propranolol (INDERAL) tablet 10 mg, 10 mg, Oral, BID, Clarnce Flock, MD, 10 mg at 11/26/21 1001   traZODone (DESYREL) tablet 50 mg, 50 mg, Oral, QHS, Clarnce Flock, MD, 50 mg at 11/25/21 2157   Patients Current Diet:  Diet Order                  Diet Heart Room service appropriate? Yes; Fluid consistency: Thin  Diet effective now                         Precautions / Restrictions Precautions Precautions: Fall Restrictions Weight Bearing Restrictions: No    Has the patient had 2 or more falls or a fall with injury in the past year? No   Prior Activity Level  Pt. Went out  1-2x a week   Prior Functional Level Self Care: Did the patient need help bathing, dressing, using the toilet or eating? Needed some help   Indoor Mobility: Did the patient need assistance with walking from room to room (with or without device)? Needed some help   Stairs: Did the patient need assistance with internal or external stairs (with or without device)? Needed some help   Functional Cognition: Did the patient need help planning regular tasks such as shopping or  remembering to take medications? Needed some help   Patient Information Are you of Hispanic, Latino/a,or Spanish origin?: A. No, not of Hispanic, Latino/a, or Spanish origin What is your race?: B. Black or African American Do you need or want an interpreter to communicate with a doctor or health care staff?: 0. No   Patient's Response To:  Health Literacy and Transportation Is the patient able to respond to health literacy and transportation needs?: Yes Health Literacy - How often do you need to have someone help you when you read instructions, pamphlets, or other written material from your doctor or pharmacy?: Always In the past 12 months, has lack of transportation kept you from medical appointments or from getting medications?: Yes In the past 12 months, has lack of transportation kept you from meetings, work, or from getting things needed for daily living?: Yes   [place] Devices/Equipment: Environmental consultant (specify type) Home Equipment: Rollator (4 wheels)   Prior Device Use: Indicate devices/aids used by the patient prior to current illness, exacerbation or injury? Manual wheelchair and Walker   Current Functional Level Cognition   Overall Cognitive Status: Within Functional Limits for tasks assessed Orientation Level: Oriented X4 General Comments:  (high anxiety this am about d/c)    Extremity Assessment (includes Sensation/Coordination)   Upper Extremity Assessment: LUE  deficits/detail LUE Deficits / Details: 3/5 GH flexion. 5/5 elbow flexion and extension. Diminshed grip strength (significantly compared to R). LUE: Shoulder pain with ROM, Shoulder pain at rest LUE Sensation: decreased light touch  Lower Extremity Assessment: Defer to PT evaluation RLE Deficits / Details: <3/5 hip flexion (unable to achieve full range), 3/5 knee extension and DF. Inability to sustain contraction for MMT. Pt reports hypersensitivity with LT and deep pressure testing. Decreased coordination with heel-to-shin test.     ADLs   Overall ADL's : Needs assistance/impaired Grooming: Wash/dry face, Supervision/safety, Set up, Sitting Lower Body Dressing: Maximal assistance, Sitting/lateral leans Lower Body Dressing Details (indicate cue type and reason): to don socks Toileting- Clothing Manipulation and Hygiene: Maximal assistance, +2 for physical assistance, Sit to/from stand General ADL Comments: Pt SBA  for seated bathing, upper body and lower body~ 10 min. MAX A + 2 to stand, pt able to access LB for perihygiene, standing, ~1 min before self initiating return to sit. Pt SBA + VCs  for don/doff socks and underwear, seated in bed, VCs for technique and saftey. Pt MAX A + 2 + RW +VCs for functional mobility, bed to chair, ambulating, ~ 20 ft.     Mobility   Overal bed mobility: Needs Assistance Bed Mobility: Supine to Sit, Sit to Supine Supine to sit: Min assist, HOB elevated Sit to supine: Min assist General bed mobility comments:  (not assessed, pt up in chair upon arrival)     Transfers   Overall transfer level: Needs assistance Equipment used: Rolling walker (2 wheels) Transfers: Sit to/from Stand Sit to Stand: Mod assist, Max assist Bed to/from chair/wheelchair/BSC transfer type:: Stand pivot Stand pivot transfers: Max assist, +2 physical assistance  Lateral/Scoot Transfers: Min assist, +2 safety/equipment General transfer comment:  (MaxA needed to raise from commode, MaxA  to maintain balance while completing hygeine in standing.)     Ambulation / Gait / Stairs / Wheelchair Mobility   Ambulation/Gait Ambulation/Gait assistance: Min assist, Mod assist Gait Distance (Feet): 5 Feet Assistive device: Rolling walker (2 wheels) Gait Pattern/deviations: Step-to pattern, Trunk flexed, Ataxic General Gait Details:  (Pt ambulated 23ft x 2  requiring moderate blocking of R knee to prevent buckling during weight acceptance.)     Posture / Balance Dynamic Sitting Balance Sitting balance - Comments: Good sitting balance reaching without BOS at EOB. Oberserves to have posterior trunk lean during lateral/scoot transfers Balance Overall balance assessment: Needs assistance Sitting-balance support: No upper extremity supported, Feet supported Sitting balance-Leahy Scale: Good Sitting balance - Comments: Good sitting balance reaching without BOS at EOB. Oberserves to have posterior trunk lean during lateral/scoot transfers Postural control: Posterior lean Standing balance support: Bilateral upper extremity supported, During functional activity, Reliant on assistive device for balance Standing balance-Leahy Scale: Poor Standing balance comment: MAX A x2 to maintain static standing position utilizing RW for BUE support. Pt had difficulty positioning hands safely/correctly on RW.     Special needs/care consideration Skin WNL and Special service needs none    Previous Home Environment (from acute therapy documentation) Living Arrangements: Alone  Lives With: Alone Available Help at Discharge: Family, Available PRN/intermittently, Friend(s) Type of Home: House Home Layout: One level Home Access: Stairs to enter Entrance Stairs-Rails: Right (states it is wobbly and not safe to use) Entrance Stairs-Number of Steps: 5 Bathroom Shower/Tub: Engineer, manufacturing systems: Standard Bathroom Accessibility: No Home Care Services: No Additional Comments: Does not have shower chair  - sits on side of tub during shower due to decreased standing endurance. Uses Rollator (>1 year) due to decreased walking endurance.   Discharge Living Setting Plans for Discharge Living Setting: Patient's home Type of Home at Discharge: House Discharge Home Layout: One level Discharge Home Access: Stairs to enter Entrance Stairs-Rails: Right Entrance Stairs-Number of Steps: 4 Discharge Bathroom Shower/Tub: Tub/shower unit Discharge Bathroom Toilet: Standard Discharge Bathroom Accessibility: Yes How Accessible: Accessible via walker Does the patient have any problems obtaining your medications?: No   Social/Family/Support Systems Patient Roles: Other (Comment) Contact Information: [phone] Anticipated Caregiver: Justine Null (caregiver/friend) Anticipated Caregiver's Contact Information: [phone] Ability/Limitations of Caregiver: Can provide min-mod A Caregiver Availability: 24/7 Discharge Plan Discussed with Primary Caregiver: Yes Is Caregiver In Agreement with Plan?: Yes Does Caregiver/Family have Issues with Lodging/Transportation while Pt is in Rehab?: No   Goals Patient/Family Goal for Rehab: PT/OT Mod A Expected length of stay: 16-18 days Pt/Family Agrees to Admission and willing to participate: Yes Program Orientation Provided & Reviewed with Pt/Caregiver Including Roles  & Responsibilities: Yes   Decrease burden of Care through IP rehab admission: Specialzed equipment needs, Bowel and bladder program, and Patient/family education   Possible need for SNF placement upon discharge: not anticipated   Patient Condition: I have reviewed medical records from [facility], spoken with CM, and patient. I discussed via phone for inpatient rehabilitation assessment.  Patient will benefit from ongoing PT and OT, can actively participate in 3 hours of therapy a day 5 days of the week, and can make measurable gains during the admission.  Patient will also benefit  from the coordinated team approach during an Inpatient Acute Rehabilitation admission.  The patient will receive intensive therapy as well as Rehabilitation physician, nursing, social worker, and care management interventions.  Due to bladder management, bowel management, safety, skin/wound care, disease management, medication administration, pain management, and patient education the patient requires 24 hour a day rehabilitation nursing.  The patient is currently min-max A  with mobility and basic ADLs.  Discharge setting and therapy post discharge at home with home health is anticipated.  Patient has agreed to participate in the Acute Inpatient Rehabilitation Program and will admit today.  Preadmission Screen Completed By:  Genella Mech, 11/26/2021 10:11 AM ______________________________________________________________________   Discussed status with Dr. Ranell Patrick  on 11/26/21 at 53 and received approval for admission today.   Admission Coordinator:  Genella Mech, CCC-SLP, time 1010/Date 11/26/21    Assessment/Plan: Diagnosis: CVA Does the need for close, 24 hr/day Medical supervision in concert with the patient's rehab needs make it unreasonable for this patient to be served in a less intensive setting? Yes Co-Morbidities requiring supervision/potential complications: HTN, tobacco use, AVM, seizure disorder, depression, morbid obesity Due to bladder management, bowel management, safety, skin/wound care, disease management, medication administration, pain management, and patient education, does the patient require 24 hr/day rehab nursing? Yes Does the patient require coordinated care of a physician, rehab nurse, PT, OT to address physical and functional deficits in the context of the above medical diagnosis(es)? Yes Addressing deficits in the following areas: balance, endurance, locomotion, strength, transferring, bowel/bladder control, bathing, dressing, feeding, grooming, toileting, and  psychosocial support Can the patient actively participate in an intensive therapy program of at least 3 hrs of therapy 5 days a week? Yes The potential for patient to make measurable gains while on inpatient rehab is excellent Anticipated functional outcomes upon discharge from inpatient rehab: supervision PT, supervision OT, independent SLP Estimated rehab length of stay to reach the above functional goals is: 10-14 days Anticipated discharge destination: Home 10. Overall Rehab/Functional Prognosis: excellent     MD Signature: Leeroy Cha, MD

## 2021-11-28 NOTE — Progress Notes (Signed)
Inpatient Rehabilitation Care Coordinator Assessment and Plan Patient Details  Name: Kristin Burgess MRN: [id number] Date of Birth: [birth year]  Today's Date: 11/28/2021  Hospital Problems: Principal Problem:   Acute bilat watershed infarction [facility])  Past Medical History:  Past Medical History:  Diagnosis Date   AVM (arteriovenous malformation) brain    Brain bleed (HCC)    Depression    Hyperlipidemia    Hypertension    Past Surgical History:  Past Surgical History:  Procedure Laterality Date   BRAIN SURGERY  2015   TUBAL LIGATION     Social History:  reports that she has been smoking cigarettes. She has been smoking an average of .25 packs per day. She has never used smokeless tobacco. She reports that she does not drink alcohol and does not use drugs.  Family / Support Systems Other Supports: Alvira Philips Anticipated Caregiver: Justine Null Ability/Limitations of Caregiver: Min/MOD Caregiver Availability: 24/7 Family Dynamics: support from mother, family and friends  Social History Preferred language: English Religion: None Health Literacy - How often do you need to have someone help you when you read instructions, pamphlets, or other written material from your doctor or pharmacy?: Never Writes: Yes Employment Status: Disabled Marine scientist Issues: n/a   Abuse/Neglect Abuse/Neglect Assessment Can Be Completed: Yes Physical Abuse: Denies Verbal Abuse: Denies Sexual Abuse: Denies Exploitation of patient/patient's resources: Denies Self-Neglect: Denies Possible abuse reported to:: Other (Comment)  Patient response to: Social Isolation - How often do you feel lonely or isolated from those around you?: Never  Emotional Status Recent Psychosocial Issues: coping, hx of depression Psychiatric History: hx of depression Substance Abuse History: n/a  Patient / Family Perceptions, Expectations & Goals Pt/Family understanding of illness & functional  limitations: yes Premorbid pt/family roles/activities: Family and friends assit with ADLS Anticipated changes in roles/activities/participation: caregiver Pt/family expectations/goals: MOD A  Recruitment consultant Agencies: None Premorbid Home Care/DME Agencies: Other (Comment) Adult nurse, Product manager) Transportation available at discharge: friends if able. Transportation resources to be provided Is the patient able to respond to transportation needs?: Yes In the past 12 months, has lack of transportation kept you from medical appointments or from getting medications?: Yes In the past 12 months, has lack of transportation kept you from meetings, work, or from getting things needed for daily living?: Yes Resource referrals recommended: Neuropsychology  Discharge Planning Living Arrangements: Alone Support Systems: Friends/neighbors, Parent, Other relatives Type of Residence: Private residence Insurance Resources: Medicare, [employer] (specify county) Architect: SSD, Restaurant manager, fast food Screen Referred: No Living Expenses: Psychologist, sport and exercise Management: Patient Does the patient have any problems obtaining your medications?: Yes (Describe) Home Management: independent Patient/Family Preliminary Plans: friend able to assit if needed Care Coordinator Anticipated Follow Up Needs: HH/OP DC Planning Additional Notes/Comments: Rails are unsteady,DME sizing Expected length of stay: 16-18 Days  Clinical Impression Sw made attempt to see patient on 12/8, patient not in room. SW will follow up on today  Andria Rhein 11/28/2021, 11:46 AM

## 2021-11-28 NOTE — IPOC Note (Signed)
Overall Plan of Care [facility]) Patient Details Name: Kristin Burgess MRN: [id number] DOB: Jan 13, 1961  Admitting Diagnosis: Acute bilat watershed infarction [facility])  Hospital Problems: Principal Problem:   Acute bilat watershed infarction [facility])     Functional Problem List: Nursing Bladder, Bowel, Medication Management, Safety, Endurance, Pain  PT Balance, Behavior, Edema, Endurance, Motor, Pain, Perception, Safety, Sensory  OT Balance, Cognition, Endurance, Motor, Pain, Safety, Vision, Skin Integrity, Sensory  SLP    TR         Basic ADL's: OT Grooming, Bathing, Dressing, Toileting, Eating     Advanced  ADL's: OT       Transfers: PT Bed Mobility, Bed to Chair, Car, Occupational psychologist, Research scientist (life sciences): PT Ambulation, Psychologist, prison and probation services, Stairs     Additional Impairments: OT Fuctional Use of Upper Extremity  SLP        TR      Anticipated Outcomes Item Anticipated Outcome  Self Feeding mod I  Swallowing      Basic self-care  min A  Toileting  min A   Bathroom Transfers S  Bowel/Bladder  manage bowel and bladder w mod I assist  Transfers  Supervision assist with LRAD  Locomotion  supervision assist with LRAD ambulatory at house hold level  Communication     Cognition     Pain  manage pain w mod I assist  Safety/Judgment  maintain w cues/reminders   Therapy Plan: PT Intensity: Minimum of 1-2 x/day ,45 to 90 minutes PT Frequency: 5 out of 7 days PT Duration Estimated Length of Stay: 14-18 days OT Intensity: Minimum of 1-2 x/day, 45 to 90 minutes OT Frequency: 5 out of 7 days OT Duration/Estimated Length of Stay: 2 - 2.5 weeks     Due to the current state of emergency, patients may not be receiving their 3-hours of Medicare-mandated therapy.   Team Interventions: Nursing Interventions Bladder Management, Disease Management/Prevention, Medication Management, Discharge Planning, Pain Management, Bowel Management, Patient/Family Education  PT  interventions Balance/vestibular training, Cognitive remediation/compensation, Ambulation/gait training, Community reintegration, DME/adaptive equipment instruction, Neuromuscular re-education, Psychosocial support, Stair training, UE/LE Strength taining/ROM, Wheelchair propulsion/positioning, Discharge planning, Functional electrical stimulation, Pain management, Skin care/wound management, Therapeutic Activities, UE/LE Coordination activities, Disease management/prevention, Functional mobility training, Patient/family education, Splinting/orthotics, Therapeutic Exercise, Visual/perceptual remediation/compensation  OT Interventions Balance/vestibular training, Disease mangement/prevention, Neuromuscular re-education, Self Care/advanced ADL retraining, Therapeutic Exercise, Wheelchair propulsion/positioning, UE/LE Strength taining/ROM, Skin care/wound managment, Pain management, DME/adaptive equipment instruction, Cognitive remediation/compensation, Community reintegration, Functional electrical stimulation, Patient/family education, UE/LE Coordination activities, Visual/perceptual remediation/compensation, Therapeutic Activities, Splinting/orthotics, Psychosocial support, Functional mobility training, Discharge planning  SLP Interventions    TR Interventions    SW/CM Interventions     Barriers to Discharge MD  Medical stability  Nursing Lack of/limited family support, Home environment access/layout 1 level 5 ste, right rail (wobbly) solo, mother and friend can assist  PT Inaccessible home environment, Decreased caregiver support, Home environment access/layout, Lack of/limited family support, Weight, Medication compliance, Behavior    OT Inaccessible home environment, Home environment access/layout, Decreased caregiver support    SLP      SW       Team Discharge Planning: Destination: PT-Home ,OT- Home , SLP-  Projected Follow-up: PT-Home health PT, OT-  Home health OT, SLP-  Projected  Equipment Needs: PT-To be determined, Rolling walker with 5" wheels, Wheelchair cushion (measurements), Wheelchair (measurements), OT- To be determined, SLP-  Equipment Details: PT- , OT-  Patient/family involved in discharge planning: PT- Patient,  OT-Patient,  SLP-   MD ELOS: 10-14d  Medical Rehab Prognosis:  Good Assessment: 60 year old right-handed female with history of hypertension, tobacco use, AVM, with surgery 2015, seizure disorder maintained on Lamictal depression, morbid obesity BMI 39.93, .  Per chart review patient lives alone.  1 level home 5 steps to entry.  Modified independent for ambulation.  Family and friends assist with ADLs.  Presented to [facility] 11/22/2021 with progressive right leg weakness over an 8-day period as well as a recent fall when getting out of bed without loss of conscious.  She denied any lightheadedness or syncope.  Cranial CT scan showed subtle hypodensity of the deep white matter of the left parietal lobe consistent with infarction although age-indeterminate.  Unchanged nonacute encephalomalacia of the right parietal vertex.  CT angiogram head and neck no large vessel occlusion or significant stenosis.  No evidence of recurrent AVM.  MRI scattered small acute infarcts in the parasagittal left frontal parietal lobe much greater than right frontal lobe representing ACA/MCA watershed infarcts.  Patient did not receive tPA.  Echocardiogram with ejection fraction of 60 to 65% no wall motion abnormalities grade 1 diastolic dysfunction.  Admission chemistries unremarkable glucose 107, alcohol negative, hemoglobin A1c 5.9.  Neurology follow-up currently maintained on aspirin 81 mg daily and Plavix 75 mg daily for CVA prophylaxis x3 weeks then aspirin alone.  Subcutaneous Lovenox for DVT prophylaxis.  Tolerating a regular consistency diet.  Therapy evaluations completed due to patient's right side weakness and decreased functional mobility was admitted for a comprehensive rehab  program. Currently complains of constipation.      See Team Conference Notes for weekly updates to the plan of care

## 2021-11-28 NOTE — Plan of Care (Signed)
  Problem: RH Balance Goal: LTG Patient will maintain dynamic sitting balance (PT) Description: LTG:  Patient will maintain dynamic sitting balance with assistance during mobility activities (PT) Flowsheets (Taken 11/28/2021 0723) LTG: Pt will maintain dynamic sitting balance during mobility activities with:: Independent with assistive device  Goal: LTG Patient will maintain dynamic standing balance (PT) Description: LTG:  Patient will maintain dynamic standing balance with assistance during mobility activities (PT) Flowsheets (Taken 11/28/2021 0723) LTG: Pt will maintain dynamic standing balance during mobility activities with:: Supervision/Verbal cueing   Problem: RH Bed Mobility Goal: LTG Patient will perform bed mobility with assist (PT) Description: LTG: Patient will perform bed mobility with assistance, with/without cues (PT). Flowsheets (Taken 11/28/2021 0723) LTG: Pt will perform bed mobility with assistance level of: Independent with assistive device    Problem: RH Bed to Chair Transfers Goal: LTG Patient will perform bed/chair transfers w/assist (PT) Description: LTG: Patient will perform bed to chair transfers with assistance (PT). Flowsheets (Taken 11/28/2021 0723) LTG: Pt will perform Bed to Chair Transfers with assistance level: Supervision/Verbal cueing   Problem: RH Car Transfers Goal: LTG Patient will perform car transfers with assist (PT) Description: LTG: Patient will perform car transfers with assistance (PT). Flowsheets (Taken 11/28/2021 0723) LTG: Pt will perform car transfers with assist:: Contact Guard/Touching assist   Problem: RH Ambulation Goal: LTG Patient will ambulate in controlled environment (PT) Description: LTG: Patient will ambulate in a controlled environment, # of feet with assistance (PT). Flowsheets (Taken 11/28/2021 0723) LTG: Pt will ambulate in controlled environ  assist needed:: Supervision/Verbal cueing LTG: Ambulation distance in controlled  environment: 159ft with LRAD Goal: LTG Patient will ambulate in home environment (PT) Description: LTG: Patient will ambulate in home environment, # of feet with assistance (PT). Flowsheets (Taken 11/28/2021 0723) LTG: Pt will ambulate in home environ  assist needed:: Supervision/Verbal cueing LTG: Ambulation distance in home environment: 71ft wth LRAD   Problem: RH Wheelchair Mobility Goal: LTG Patient will propel w/c in controlled environment (PT) Description: LTG: Patient will propel wheelchair in controlled environment, # of feet with assist (PT) Flowsheets (Taken 11/28/2021 0723) LTG: Pt will propel w/c in controlled environ  assist needed:: Supervision/Verbal cueing LTG: Propel w/c distance in controlled environment: 173ft   Problem: RH Stairs Goal: LTG Patient will ambulate up and down stairs w/assist (PT) Description: LTG: Patient will ambulate up and down # of stairs with assistance (PT) Flowsheets (Taken 11/28/2021 0723) LTG: Pt will ambulate up/down stairs assist needed:: Minimal Assistance - Patient > 75% LTG: Pt will  ambulate up and down number of stairs: 5 steps with 1 rail to access home

## 2021-11-28 NOTE — Progress Notes (Signed)
Occupational Therapy Session Note  Patient Details  Name: Kristin Burgess MRN: [id number] Date of Birth: [birth year]  Today's Date: 11/28/2021 OT Individual Time: 7510-2585 OT Individual Time Calculation (min): 53 min  and Today's Date: 11/28/2021 OT Missed Time: 60 Minutes Missed Time Reason: Patient unwilling/refused to participate without medical reason   Short Term Goals: Week 1:  OT Short Term Goal 1 (Week 1): Pt will don pants with max A and AE PRN. OT Short Term Goal 2 (Week 1): Pt will complete 1/3 steps of toileting with S. OT Short Term Goal 3 (Week 1): Pt will complete toilet transfer with min A + LRAD.  Skilled Therapeutic Interventions/Progress Updates:    Session Attempt 1: Pt received semi-reclined in bed at scheduled therapy time. Adamantly declining therapy at this time due to havening not received anxiety medications yet. Will alert scheduling to schedule pt in future therapy sessions post med pass. Pt missed 60 min of scheduled OT due to refusal. Pt left semi-reclined in bed with bed alarm engaged, call bell in reach, and all immediate needs met.    Session 2 [phone]): Pt received seated in w/c, no initial c/o pain, agreeable to therapy. Session focus on self-care retraining, activity tolerance, static/dynamic standing balance. LUE NMR in prep for improved ADL/IADL/func mobility performance + decreased caregiver burden. Declines need for ADL. Total A w/c transport to and from gym.  Stand-pivot x2 to L side with RW and light min A to power up and cues for split hand technique. Assessed B grip strength and administered 9HPT with the below results:  R: 44,25; average of 34.5 lbs; 29 secs  L:  0, 1/2 lbs; average of .25 lbs; 37 seconds Issued soft tan theraputty to address decreased L grip strength. Pt reports significant difficulty opening hand sanitizer wipes with meals. Pt completed 1x10 L putty squeezes, rolling into log shape, digit extension, and pincher grasp and pull.  Encouraged to practice in between therapies.   Stood to play 3 rounds of corn hole with overall CGA to min A to block R knee due to intermittent RLE pain. Able to score 76 points in 3 rounds, min A overall to keep track of and tally points in between rounds. Required seated rest break in between rounds and reports significant fatigue between rounds. Able to reach cross body and outside BOS with BUE to retreive bags with no overt LOB.  Pt left seated in w/c with safety belt alarm engaged, call bell in reach, and all immediate needs met.    Therapy Documentation Precautions:  Precautions Precautions: Fall Precaution Comments: mild L hemi, chronic RLE pain Restrictions Weight Bearing Restrictions: No  Pain: ongoing RLE pain, did not rate or request intervention   ADL: See Care Tool for more details.   Therapy/Group: Individual Therapy  Volanda Napoleon MS, OTR/L  11/28/2021, 6:46 AM

## 2021-11-28 NOTE — Progress Notes (Addendum)
PROGRESS NOTE   Subjective/Complaints:  No issues overnite , no further epigastric discomfort   ROS- neg CP, SOB, N/V/D  Objective:   No results found. Recent Labs    11/26/21 0341 11/26/21 1458  WBC 3.6* 4.3  HGB 12.8 13.4  HCT 42.2 43.5  PLT 238 263    Recent Labs    11/26/21 0341 11/26/21 1458  NA 139  --   K 3.9  --   CL 105  --   CO2 25  --   GLUCOSE 96  --   BUN 21*  --   CREATININE 0.73 0.90  CALCIUM 9.2  --      Intake/Output Summary (Last 24 hours) at 11/28/2021 0801 Last data filed at 11/27/2021 1853 Gross per 24 hour  Intake 480 ml  Output --  Net 480 ml         Physical Exam: Vital Signs Blood pressure 132/65, pulse (!) 58, temperature 98.4 F (36.9 C), resp. rate 18, height 5\' 5"  (1.651 m), weight 119.8 kg, SpO2 98 %.   General: No acute distress, obese Mood and affect are appropriate Heart: Regular rate and rhythm no rubs murmurs or extra sounds Lungs: Clear to auscultation, breathing unlabored, no rales or wheezes Abdomen: Positive bowel sounds, soft nontender to palpation, nondistended Extremities: No clubbing, cyanosis, or edema Skin: No evidence of breakdown, no evidence of rash Neurologic: Cranial nerves II through XII intact, motor strength is 5/5 in bilateral deltoid, bicep, tricep, grip, 3- R and 5/5 left hip flexor, knee extensors, ankle dorsiflexor and plantar flexor  Musculoskeletal: Full range of motion in all 4 extremities. No joint swelling   Assessment/Plan: 1. Functional deficits which require 3+ hours per day of interdisciplinary therapy in a comprehensive inpatient rehab setting. Physiatrist is providing close team supervision and 24 hour management of active medical problems listed below. Physiatrist and rehab team continue to assess barriers to discharge/monitor patient progress toward functional and medical goals  Care Tool:  Bathing    Body parts bathed  by patient: Right arm, Left arm, Chest, Abdomen, Front perineal area, Left upper leg, Right upper leg, Face   Body parts bathed by helper: Buttocks, Right lower leg, Left lower leg     Bathing assist Assist Level: Moderate Assistance - Patient 50 - 74%     Upper Body Dressing/Undressing Upper body dressing   What is the patient wearing?: Pull over shirt    Upper body assist Assist Level: Supervision/Verbal cueing    Lower Body Dressing/Undressing Lower body dressing      What is the patient wearing?: Underwear/pull up     Lower body assist Assist for lower body dressing: Total Assistance - Patient < 25%     Toileting Toileting    Toileting assist Assist for toileting: Moderate Assistance - Patient 50 - 74%     Transfers Chair/bed transfer  Transfers assist     Chair/bed transfer assist level: Moderate Assistance - Patient 50 - 74%     Locomotion Ambulation   Ambulation assist      Assist level: Maximal Assistance - Patient 25 - 49% Assistive device: Hand held assist Max distance: 5   Walk  10 feet activity   Assist     Assist level: Maximal Assistance - Patient 25 - 49% Assistive device: Hand held assist   Walk 50 feet activity   Assist Walk 50 feet with 2 turns activity did not occur: Safety/medical concerns         Walk 150 feet activity   Assist Walk 150 feet activity did not occur: Safety/medical concerns         Walk 10 feet on uneven surface  activity   Assist Walk 10 feet on uneven surfaces activity did not occur: Safety/medical concerns         Wheelchair     Assist Is the patient using a wheelchair?: Yes Type of Wheelchair: Manual    Wheelchair assist level: Minimal Assistance - Patient > 75% Max wheelchair distance: 1103ft    Wheelchair 50 feet with 2 turns activity    Assist        Assist Level: Minimal Assistance - Patient > 75%   Wheelchair 150 feet activity     Assist      Assist  Level: Moderate Assistance - Patient 50 - 74%   Blood pressure 132/65, pulse (!) 58, temperature 98.4 F (36.9 C), resp. rate 18, height 5\' 5"  (1.651 m), weight 119.8 kg, SpO2 98 %.  Medical Problem List and Plan: 1.  Right side weakness functional deficits secondary to scattered acute infarct parasagittal left frontal parietal lobe much greater then right frontal lobe representing ACA/MCA watershed infarcts             -patient may shower             -ELOS/Goals: 10-14 days modI 2.  Impaired mobility, ambulating 20 feet: continue lovenox.             -antiplatelet therapy: Aspirin 81 mg daily and Plavix 75 mg day x3 weeks total then aspirin alone 3. Pain Management: Neurontin 300 mg daily 4. Mood: Trazodone 50 mg nightly, Cymbalta 30 mg daily Xanax as needed             -antipsychotic agents: N/A 5. Neuropsych: This patient is capable of making decisions on her own behalf. 6. Skin/Wound Care: Routine skin checks 7. Fluids/Electrolytes/Nutrition: Routine in and outs with follow-up chemistries 8.  Hypertension.  HCTZ 12.5 mg daily, Norvasc 10 mg daily, Inderal 10 mg twice daily.  Monitor with increased mobility 9.  History of AVM with surgery 2015.  Follow-up outpatient 10.  Seizure disorder.  Continue Lamictal 100 mg nightly 11.  Hyperlipidemia.  Lipitor 12.  Tobacco abuse.  Provide counseling, we discussed that this is the most important factor in preventing recurrent stroke  13.  Obesity.  BMI 43.93.  Dietary follow-up 14.Insomnia/constipation/hypertension/anxiety: start magnesium gluconate 250mg   HS  15.  Epigastric discomfort with swallowing start protonix and monitor , improving  LOS: 2 days A FACE TO FACE EVALUATION WAS PERFORMED  2016 11/28/2021, 8:01 AM

## 2021-11-29 MED ORDER — LAMOTRIGINE ER 100 MG PO TB24
100.0000 mg | ORAL_TABLET | Freq: Every day | ORAL | Status: DC
  Administered 2021-11-29 – 2021-12-04 (×6): 100 mg via ORAL
  Filled 2021-11-29 (×9): qty 1

## 2021-11-29 NOTE — Progress Notes (Signed)
Physical Therapy Session Note  Patient Details  Name: Kristin Burgess MRN: [id number] Date of Birth: [birth year]  Today's Date: 11/29/2021 PT Individual Time: 1304-1400 PT Individual Time Calculation (min): 56 min   Short Term Goals: Week 1:  PT Short Term Goal 1 (Week 1): Pt will perform bed mobiltiy with min assist consistnetly PT Short Term Goal 2 (Week 1): Pt will transfer to [facility] with min assist and LRAD PT Short Term Goal 3 (Week 1): Pt will ambulate 71f with  min assist and LRAD PT Short Term Goal 4 (Week 1): Pt will ascend 4 steps with BUE support and mod assist Week 2:     Skilled Therapeutic Interventions/Progress Updates: \  Pt received sitting in recliner and agreeable to PT. [facility] balance instructed by PT while engaged in gross motor task of Wii bowling. Pt able to tolerate 5 frames +2 frames in standing prior to requesting seated rest break due to RLE fatigue. 1-2 UE supported on RW throughout. Pt also performed horse shoe toss to target following lateral reach while standing on airex pad. Performed x 8 Bil with 1 UE supported on RW with min assist for safety and cues for improved use of ankle strategy to prevent postreior LOB. Gait training with RW x 1864fwith min assist for safety. Cues for improved posture and activation of hip extension while standing on RLE to prevent LE instability. No overt LOB noted. WC mobility x 1503fith supervision assist with cues for symmetry on the RUE. Patient returned to room and performed stand pivot to recliner with RW and CGA. Pt left sitting in recliner with call bell in reach and all needs met.          Therapy Documentation Precautions:  Precautions Precautions: Fall Precaution Comments: mild L hemi, chronic RLE pain Restrictions Weight Bearing Restrictions: No  Vital Signs: Therapy Vitals Temp: 98.2 F (36.8 C) Pulse Rate: 70 Resp: 18 BP:  129/70 Patient Position (if appropriate): Sitting Oxygen Therapy SpO2: 97 % O2 Device: Room Air Pain: denies    Therapy/Group: Individual Therapy  AusLorie Phenix/09/2021, 2:51 PM

## 2021-11-29 NOTE — Progress Notes (Signed)
Physical Therapy Session Note  Patient Details  Name: Kristin Burgess MRN: [id number] Date of Birth: 28-Feb-1961  Today's Date: 11/29/2021 PT Individual Time: 1100-1200 PT Individual Time Calculation (min): 60 min   Short Term Goals: Week 1:  PT Short Term Goal 1 (Week 1): Pt will perform bed mobiltiy with min assist consistnetly PT Short Term Goal 2 (Week 1): Pt will transfer to [facility] with min assist and LRAD PT Short Term Goal 3 (Week 1): Pt will ambulate 64ft with  min assist and LRAD PT Short Term Goal 4 (Week 1): Pt will ascend 4 steps with BUE support and mod assist  Skilled Therapeutic Interventions/Progress Updates:  Patient seated upright in w/c on entrance to room. Patient alert and agreeable to PT session. Education provided throughout session on stroke recovery, expectations for d/c, current impairments.   Patient with no pain complaint throughout session.  Therapeutic Activity: Transfers: Patient performed sit<>stand and stand pivot transfers throughout session with MinA for power up and attaining balance. Improves to CGA following NMR. Provided vc for proper hand placement/ progression and increasing forward lean.  Neuromuscular Re-ed: NMR facilitated during session with focus on standing balance, proprioception, transfer performance. Pt guided in sit<>stand transfers to no AD progressing from using BUE to push from mat table to hands on thighs. Pt is unsure of herself and reaches out with hands for stabilizing initially and able to progress to attaining standing balance with no UE support. Pt also guided in minisquats with bias toward R side and then with use of low step under LLE.  NMR performed for improvements in motor control and coordination, balance, sequencing, judgement, and self confidence/ efficacy in performing all aspects of mobility at highest level of independence.   Gait Training:  Patient ambulated 70' x1/ 21' x1/ 100' x1 using RW to return to room from main therapy  gym. VC for "strong knee" with R stance phase. Demonstrated low step height/ length on RLE.   Patient seated upright  in w/c at end of session with brakes locked, seat alarm set, and all needs within reach.     Therapy Documentation Precautions:  Precautions Precautions: Fall Precaution Comments: mild L hemi, chronic RLE pain Restrictions Weight Bearing Restrictions: No General:   Pain: Pain Assessment Pain Scale: 0-10 Pain Score: 0-No pain  Therapy/Group: Individual Therapy  Loel Dubonnet PT, DPT 11/29/2021, 2:25 PM

## 2021-11-29 NOTE — Progress Notes (Signed)
Occupational Therapy Session Note  Patient Details  Name: Kristin Burgess MRN: [id number] Date of Birth: May 04, 1961  Today's Date: 11/29/2021 OT Individual Time: 1155-2080 OT Individual Time Calculation (min): 53 min    Short Term Goals: Week 1:  OT Short Term Goal 1 (Week 1): Pt will don pants with max A and AE PRN. OT Short Term Goal 2 (Week 1): Pt will complete 1/3 steps of toileting with S. OT Short Term Goal 3 (Week 1): Pt will complete toilet transfer with min A + LRAD.  Skilled Therapeutic Interventions/Progress Updates:    Pt received seated at sink in recliner, reports having already completed ADL this morning, agreeable to therapy. Session focus on  static/dynamic balance retraining, BUE strengthening, activity tolerance, energy conservation education in prep for improved ADL/IADL/func mobility performance + decreased caregiver burden. Stand-pivot x3 throughout session with CGA and cues for split hand technique with RW. Total A w/c transport to and from gym.  Side-stepping up and down mat x2 to complete Sales executive. Only able to place 2 legos prior to requiring seated rest break due to increase in BLE pain/soreness. LPN present to administer pain meds with pt reporting improvement in pain later in session. Requesting to remain seated for session.  Completed 1x10 of the following with 2 lb wrist weights on BUE: overhead reaches, chest openers, B punches, small/large forward/backward horizontal arm circles. Completed 3x20 volleys with use of 2 lb dowel rod and beach ball. Reports 3/10 on modified RPE , but side-stepping activity as 8/10. Educated on importance of self-monitoring for fatigue to reduce falls risk and conserve energy.   Side-stepped ~10 feet > w/c with CGA overall and stand-pivot > recliner per pt request in similar manner.  Pt left seated in recliner with chair alarm engaged, call bell in reach, and all immediate needs met.    Therapy Documentation Precautions:   Precautions Precautions: Fall Precaution Comments: mild L hemi, chronic RLE pain Restrictions Weight Bearing Restrictions: No  Pain: ongoing BLE pain, did not rate, LPN administered pain rx and pt reporting improvement in pain   ADL: See Care Tool for more details.  Therapy/Group: Individual Therapy  Volanda Napoleon MS, OTR/L  11/29/2021, 6:50 AM

## 2021-11-30 MED ORDER — EXERCISE FOR HEART AND HEALTH BOOK
Freq: Once | Status: DC
  Filled 2021-11-30: qty 1

## 2021-11-30 MED ORDER — PREGABALIN 50 MG PO CAPS
75.0000 mg | ORAL_CAPSULE | Freq: Two times a day (BID) | ORAL | Status: DC
  Administered 2021-11-30 – 2021-12-05 (×11): 75 mg via ORAL
  Filled 2021-11-30 (×11): qty 1

## 2021-11-30 MED ORDER — BLOOD PRESSURE CONTROL BOOK
Freq: Once | Status: DC
  Filled 2021-11-30: qty 1

## 2021-11-30 NOTE — Progress Notes (Signed)
PROGRESS NOTE   Subjective/Complaints:  Pt reports needs more nerve pain meds- gabapentin- which she's been on for years isn't working.  Otherwise "fine".   ROS-  Pt denies SOB, abd pain, CP, N/V/C/D, and vision changes   Objective:   No results found. No results for input(s): WBC, HGB, HCT, PLT in the last 72 hours. No results for input(s): NA, K, CL, CO2, GLUCOSE, BUN, CREATININE, CALCIUM in the last 72 hours.  Intake/Output Summary (Last 24 hours) at 11/30/2021 0956 Last data filed at 11/30/2021 0700 Gross per 24 hour  Intake 560 ml  Output --  Net 560 ml        Physical Exam: Vital Signs Blood pressure 130/70, pulse 62, temperature 98.1 F (36.7 C), temperature source Oral, resp. rate 18, height 5\' 5"  (1.651 m), weight 119.8 kg, SpO2 94 %.    General: awake, alert, appropriate, sitting up in bedside chair; NAD HENT: conjugate gaze; oropharynx moist CV: regular rate; no JVD Pulmonary: CTA B/L; no W/R/R- good air movement GI: soft, NT, ND, (+)BS Psychiatric: appropriate; interactive Neurological: alert  Extremities: No clubbing, cyanosis, or edema Skin: No evidence of breakdown, no evidence of rash Neurologic: Cranial nerves II through XII intact, motor strength is 5/5 in bilateral deltoid, bicep, tricep, grip, 3- R and 5/5 left hip flexor, knee extensors, ankle dorsiflexor and plantar flexor  Musculoskeletal: Full range of motion in all 4 extremities. No joint swelling   Assessment/Plan: 1. Functional deficits which require 3+ hours per day of interdisciplinary therapy in a comprehensive inpatient rehab setting. Physiatrist is providing close team supervision and 24 hour management of active medical problems listed below. Physiatrist and rehab team continue to assess barriers to discharge/monitor patient progress toward functional and medical goals  Care Tool:  Bathing    Body parts bathed by  patient: Right arm, Left arm, Chest, Abdomen, Front perineal area, Left upper leg, Right upper leg, Face   Body parts bathed by helper: Buttocks, Right lower leg, Left lower leg     Bathing assist Assist Level: Moderate Assistance - Patient 50 - 74%     Upper Body Dressing/Undressing Upper body dressing   What is the patient wearing?: Pull over shirt    Upper body assist Assist Level: Supervision/Verbal cueing    Lower Body Dressing/Undressing Lower body dressing      What is the patient wearing?: Underwear/pull up     Lower body assist Assist for lower body dressing: Total Assistance - Patient < 25%     Toileting Toileting    Toileting assist Assist for toileting: Moderate Assistance - Patient 50 - 74%     Transfers Chair/bed transfer  Transfers assist     Chair/bed transfer assist level: Moderate Assistance - Patient 50 - 74%     Locomotion Ambulation   Ambulation assist      Assist level: Maximal Assistance - Patient 25 - 49% Assistive device: Hand held assist Max distance: 5   Walk 10 feet activity   Assist     Assist level: Maximal Assistance - Patient 25 - 49% Assistive device: Hand held assist   Walk 50 feet activity   Assist Walk 50 feet  with 2 turns activity did not occur: Safety/medical concerns         Walk 150 feet activity   Assist Walk 150 feet activity did not occur: Safety/medical concerns         Walk 10 feet on uneven surface  activity   Assist Walk 10 feet on uneven surfaces activity did not occur: Safety/medical concerns         Wheelchair     Assist Is the patient using a wheelchair?: Yes Type of Wheelchair: Manual    Wheelchair assist level: Minimal Assistance - Patient > 75% Max wheelchair distance: 1110ft    Wheelchair 50 feet with 2 turns activity    Assist        Assist Level: Minimal Assistance - Patient > 75%   Wheelchair 150 feet activity     Assist      Assist Level:  Moderate Assistance - Patient 50 - 74%   Blood pressure 130/70, pulse 62, temperature 98.1 F (36.7 C), temperature source Oral, resp. rate 18, height 5\' 5"  (1.651 m), weight 119.8 kg, SpO2 94 %.  Medical Problem List and Plan: 1.  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  Impaired mobility, ambulating 20 feet: continue lovenox.             -antiplatelet therapy: Aspirin 81 mg daily and Plavix 75 mg day x3 weeks total then aspirin alone 3. Pain Management: Neurontin 300 mg daily  12/11- will change to Lyrica 75 mg BID since her Cr will tolerate; not allergic and Gabapentin not working for her.  4. Mood: Trazodone 50 mg nightly, Cymbalta 30 mg daily Xanax as needed             -antipsychotic agents: N/A 5. Neuropsych: This patient is capable of making decisions on her own behalf. 6. Skin/Wound Care: Routine skin checks 7. Fluids/Electrolytes/Nutrition: Routine in and outs with follow-up chemistries 8.  Hypertension.  HCTZ 12.5 mg daily, Norvasc 10 mg daily, Inderal 10 mg twice daily.  Monitor with increased mobility  12/11- BP controlled- con't regimen 9.  History of AVM with surgery 2015.  Follow-up outpatient 10.  Seizure disorder.  Continue Lamictal 100 mg nightly 11.  Hyperlipidemia.  Lipitor 12.  Tobacco abuse.  Provide counseling, we discussed that this is the most important factor in preventing recurrent stroke  13.  Obesity.  BMI 43.93.  Dietary follow-up 14.Insomnia/constipation/hypertension/anxiety: start magnesium gluconate 250mg   HS  15.  Epigastric discomfort with swallowing start protonix and monitor , improving 16. Depressed  12/11- Suggest setting up to see Dr if possible-  LOS: 4 days A FACE TO FACE  EVALUATION WAS PERFORMED  Kristin Burgess 11/30/2021, 9:56 AM

## 2021-12-01 NOTE — Progress Notes (Signed)
Occupational Therapy Session Note  Patient Details  Name: Kristin Burgess MRN: [id number] Date of Birth: 03-07-1961  Today's Date: 12/01/2021 OT Individual Time: 3818-4037 OT Individual Time Calculation (min): 75 min   OT Individual Time: 1300-1331 OT Individual Time Calculation (min): 31 min   Short Term Goals: Week 1:  OT Short Term Goal 1 (Week 1): Pt will don pants with max A and AE PRN. OT Short Term Goal 2 (Week 1): Pt will complete 1/3 steps of toileting with S. OT Short Term Goal 3 (Week 1): Pt will complete toilet transfer with min A + LRAD.  Session 1: Patient met seated at sink level in wc and in agreement with OT treatment session. 0/10 pain reported at rest and with activity. Reports completing bathing/dressing with Mod I at wc level prior to this writers entrance. Completing LB dressing upon entry. Patient expressed excitement about anticipated d/c date before Christmas. Patient initiated grooming seated at sink level. Encouraged patient to complete tasks in standing and she obliged. Treatment session with focus on RUE NMR, community mobility with use of RW and activity tolerance. Laundry task with patient able to gather soiled clothing and take to laundry room on 4W. Patient with desire to walk to hospital cafeteria to get a snack. Patient completed mobility to, around and from cafeteria with use of RW. No rest breaks needed. 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 Patient also reports symptoms of CVA on Thanksgiving with weakness in RLE. Education provided on [employer]. Patient expressed verbal understanding. Returned to laundry room to transfer items from washer to dryer. Patient able to complete task with supervision A for safety. Therapeutic exercise on NuStep level 4 for 5:46 min. Session concluded with patient seated in wc with call bell within reach and all needs met.   Session 2: Patient met seated in recliner in  agreement with OT treatment session. 0/10 pain reported at rest. Functional mobility to with RW to laundry room on 4W. Patient able to retrieve laundry from washing machine and fold in standing. Onset of low back pain during laundry folding task requiring seated rest break. Functional mobility back to hospital room with RW after seated rest break. Patient able to put clothes away. Education on caution with bending and twisting 2/2 intermittent low back pain. Patient expressed verbal understanding. Session concluded with patient seated in recliner with call bell within reach, chair alarm activated and all needs met.   Therapy Documentation Precautions:  Precautions Precautions: Fall Precaution Comments: mild L hemi, chronic RLE pain Restrictions Weight Bearing Restrictions: No General:   Therapy/Group: Individual Therapy  Spike Desilets R Howerton-Davis 12/01/2021, 6:57 AM

## 2021-12-01 NOTE — Progress Notes (Signed)
PROGRESS NOTE   Subjective/Complaints:  No issues overnite   ROS-  Pt denies SOB, abd pain, CP, N/V/C/D, and vision changes   Objective:   No results found. No results for input(s): WBC, HGB, HCT, PLT in the last 72 hours. No results for input(s): NA, K, CL, CO2, GLUCOSE, BUN, CREATININE, CALCIUM in the last 72 hours.  Intake/Output Summary (Last 24 hours) at 12/01/2021 0713 Last data filed at 11/30/2021 1829 Gross per 24 hour  Intake 240 ml  Output --  Net 240 ml         Physical Exam: Vital Signs Blood pressure 134/66, pulse (!) 55, temperature 98.1 F (36.7 C), temperature source Oral, resp. rate 18, height 5\' 5"  (1.651 m), weight 119.8 kg, SpO2 97 %.   General: No acute distress Mood and affect are appropriate Heart: Regular rate and rhythm no rubs murmurs or extra sounds Lungs: Clear to auscultation, breathing unlabored, no rales or wheezes Abdomen: Positive bowel sounds, soft nontender to palpation, nondistended Extremities: No clubbing, cyanosis, or edema  Skin: No evidence of breakdown, no evidence of rash Neurologic: Cranial nerves II through XII intact, motor strength is 5/5 in bilateral deltoid, bicep, tricep, grip, 3- R and 5/5 left hip flexor, knee extensors, ankle dorsiflexor and plantar flexor  Musculoskeletal: Full range of motion in all 4 extremities. No joint swelling   Assessment/Plan: 1. Functional deficits which require 3+ hours per day of interdisciplinary therapy in a comprehensive inpatient rehab setting. Physiatrist is providing close team supervision and 24 hour management of active medical problems listed below. Physiatrist and rehab team continue to assess barriers to discharge/monitor patient progress toward functional and medical goals  Care Tool:  Bathing    Body parts bathed by patient: Right arm, Left arm, Chest, Abdomen, Front perineal area, Left upper leg, Right upper  leg, Face   Body parts bathed by helper: Buttocks, Right lower leg, Left lower leg     Bathing assist Assist Level: Moderate Assistance - Patient 50 - 74%     Upper Body Dressing/Undressing Upper body dressing   What is the patient wearing?: Pull over shirt    Upper body assist Assist Level: Supervision/Verbal cueing    Lower Body Dressing/Undressing Lower body dressing      What is the patient wearing?: Underwear/pull up     Lower body assist Assist for lower body dressing: Total Assistance - Patient < 25%     Toileting Toileting    Toileting assist Assist for toileting: Moderate Assistance - Patient 50 - 74%     Transfers Chair/bed transfer  Transfers assist     Chair/bed transfer assist level: Moderate Assistance - Patient 50 - 74%     Locomotion Ambulation   Ambulation assist      Assist level: Maximal Assistance - Patient 25 - 49% Assistive device: Hand held assist Max distance: 5   Walk 10 feet activity   Assist     Assist level: Maximal Assistance - Patient 25 - 49% Assistive device: Hand held assist   Walk 50 feet activity   Assist Walk 50 feet with 2 turns activity did not occur: Safety/medical concerns  Walk 150 feet activity   Assist Walk 150 feet activity did not occur: Safety/medical concerns         Walk 10 feet on uneven surface  activity   Assist Walk 10 feet on uneven surfaces activity did not occur: Safety/medical concerns         Wheelchair     Assist Is the patient using a wheelchair?: Yes Type of Wheelchair: Manual    Wheelchair assist level: Minimal Assistance - Patient > 75% Max wheelchair distance: 153ft    Wheelchair 50 feet with 2 turns activity    Assist        Assist Level: Minimal Assistance - Patient > 75%   Wheelchair 150 feet activity     Assist      Assist Level: Moderate Assistance - Patient 50 - 74%   Blood pressure 134/66, pulse (!) 55, temperature 98.1  F (36.7 C), temperature source Oral, resp. rate 18, height 5\' 5"  (1.651 m), weight 119.8 kg, SpO2 97 %.  Medical Problem List and Plan: 1.  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  Impaired mobility, ambulating 20 feet: continue lovenox.             -antiplatelet therapy: Aspirin 81 mg daily and Plavix 75 mg day x3 weeks total then aspirin alone 3. Pain Management: Neurontin 300 mg daily  12/11- will change to Lyrica 75 mg BID since her Cr will tolerate; not allergic and Gabapentin not working for her.  4. Mood: Trazodone 50 mg nightly, Cymbalta 30 mg daily Xanax as needed             -antipsychotic agents: N/A 5. Neuropsych: This patient is capable of making decisions on her own behalf. 6. Skin/Wound Care: Routine skin checks 7. Fluids/Electrolytes/Nutrition: Routine in and outs with follow-up chemistries 8.  Hypertension.  HCTZ 12.5 mg daily, Norvasc 10 mg daily, Inderal 10 mg twice daily.  Monitor with increased mobility  12/11- BP controlled- con't regimen 9.  History of AVM with surgery 2015.  Follow-up outpatient 10.  Seizure disorder.  Continue Lamictal 100 mg nightly 11.  Hyperlipidemia.  Lipitor 12.  Tobacco abuse.  Provide counseling, we discussed that this is the most important factor in preventing recurrent stroke  13.  Obesity.  BMI 43.93.  Dietary follow-up 14.Insomnia/constipation/hypertension/anxiety: start magnesium gluconate 250mg   HS  15.  Epigastric discomfort with swallowing start protonix and monitor , improving 16. Depressed  Neuropsych eval ordered  LOS: 5 days A FACE TO FACE EVALUATION WAS PERFORMED  2016 12/01/2021, 7:13 AM

## 2021-12-01 NOTE — Progress Notes (Signed)
Physical Therapy Session Note  Patient Details  Name: Kristin Burgess MRN: [id number] Date of Birth: 10/03/1961  Today's Date: 12/01/2021 PT Individual Time: 1031-1100 and 7253-6644 PT Individual Time Calculation (min): 29 min and 55 min  Short Term Goals: Week 1:  PT Short Term Goal 1 (Week 1): Pt will perform bed mobiltiy with min assist consistnetly PT Short Term Goal 2 (Week 1): Pt will transfer to [facility] with min assist and LRAD PT Short Term Goal 3 (Week 1): Pt will ambulate 13ft with  min assist and LRAD PT Short Term Goal 4 (Week 1): Pt will ascend 4 steps with BUE support and mod assist  Skilled Therapeutic Interventions/Progress Updates:  Session 1: Patient seated upright in recliner on entrance to room. Patient alert and agreeable to PT session.   Patient with no pain complaint throughout session.  Therapeutic Activity: Transfers: Patient performed sit<>stand and stand pivot transfers throughout session with supervision/ CGA. Provided vc for reaching back to locate seat prior to descend to sit. Pt also able to gather her laundry from dryer, and place in w/c, then switch clothes from washer to dryer. Pt sets up dryer but requires vc for reminder to close door to dryer in order to start cycle.   Gait Training:  Patient ambulated just over 300 ft x2 from room to patient laundry room and back using RW with supervision/ CGA. Demonstrated improved LLE clearance initially and then requiring vc for increasing hip/ knee flexion during swing through.  Patient seated upright in recliner at end of session with brakes locked, seat pad alarm set, and all needs within reach.   Session 2: Patient seated upright in recliner facing bed on entrance to room. Patient alert and agreeable to PT session.   Patient with no pain complaint throughout session.  Therapeutic Activity: Transfers: Patient performed sit<>stand and stand pivot transfers throughout session with supervision/ CGA. Provided vc for  reaching back to locate seat prior to descend to sit.  Gait Training:  Patient ambulated >200 ft using RW with close supervision. Demonstrated intermittent L knee buckle that she is able to catch with no LOB. Provided vc/ tc for inreasing Bil step height throughout and upright posture with decreased pressure into RW.  Neuromuscular Re-ed: NMR facilitated during session with focus on standing balance. Pt guided in dynamic gait/ balance using agility ladder. Pt guided into stepping both LE into each square and reciprocating lead step out. Stepping with one foot per square, lateral stepping with toe taps to cone placed anterior to pt with RLE then followed by LLE on return, and finally forward stepping with lateral toe touches to cone with RLE to force balance on LLE. Pt with intermittent increase in sway or minor LOB to L side which pt is able to self correct through stepping strategy but with slight delay in reaction requiring light MinA to signal.  Static/ dynamic stepping  in balance challenged with touching numbers placed on wall and just outside of pt's BOS. Has RW available and used intermittently. Pt abel to correctly use called hand and sequence of numbers up to 3 in a row. NMR performed for improvements in motor control and coordination, balance, sequencing, judgement, and self confidence/ efficacy in performing all aspects of mobility at highest level of independence.   Patient seated  in ecliner at end of session with brakes locked, seat pad alarm set, and all needs within reach.     Therapy Documentation Precautions:  Precautions Precautions: Fall Precaution Comments: mild L  hemi, chronic RLE pain Restrictions Weight Bearing Restrictions: No General:   Pain: Pt with no complaint of pain throughout session.   Therapy/Group: Individual Therapy  Loel Dubonnet PT, DPT 12/01/2021, 12:15 PM

## 2021-12-02 NOTE — Progress Notes (Signed)
PROGRESS NOTE   Subjective/Complaints: Pt would like to see someone for depression while here, on meds for anxiety and depression , discussed psychiatry vs psychology  ROS-  Pt denies SOB, abd pain, CP, N/V/C/D, and vision changes   Objective:   No results found. No results for input(s): WBC, HGB, HCT, PLT in the last 72 hours. No results for input(s): NA, K, CL, CO2, GLUCOSE, BUN, CREATININE, CALCIUM in the last 72 hours.  Intake/Output Summary (Last 24 hours) at 12/02/2021 0727 Last data filed at 12/01/2021 1803 Gross per 24 hour  Intake 720 ml  Output --  Net 720 ml         Physical Exam: Vital Signs Blood pressure (!) 117/56, pulse (!) 57, temperature 98 F (36.7 C), temperature source Oral, resp. rate 14, height 5\' 5"  (1.651 m), weight 119.8 kg, SpO2 95 %.  General: No acute distress Mood and affect are appropriate Heart: Regular rate and rhythm no rubs murmurs or extra sounds Lungs: Clear to auscultation, breathing unlabored, no rales or wheezes Abdomen: Positive bowel sounds, soft nontender to palpation, nondistended Extremities: No clubbing, cyanosis, or edema Skin: No evidence of breakdown, no evidence of rash Neurologic: Cranial nerves II through XII intact, motor strength is 5/5 in bilateral deltoid, bicep, tricep, grip, hip flexor, knee extensors, ankle dorsiflexor and plantar flexor Sensory exam normal sensation to light touch and proprioception in bilateral upper and lower extremities Sit to stand with sup needs cues to lock WC brakes   Musculoskeletal: Full range of motion in all 4 extremities. No joint swelling   Assessment/Plan: 1. Functional deficits which require 3+ hours per day of interdisciplinary therapy in a comprehensive inpatient rehab setting. Physiatrist is providing close team supervision and 24 hour management of active medical problems listed below. Physiatrist and rehab team  continue to assess barriers to discharge/monitor patient progress toward functional and medical goals  Care Tool:  Bathing    Body parts bathed by patient: Right arm, Left arm, Chest, Abdomen, Front perineal area, Left upper leg, Right upper leg, Face   Body parts bathed by helper: Buttocks, Right lower leg, Left lower leg     Bathing assist Assist Level: Moderate Assistance - Patient 50 - 74%     Upper Body Dressing/Undressing Upper body dressing   What is the patient wearing?: Pull over shirt    Upper body assist Assist Level: Supervision/Verbal cueing    Lower Body Dressing/Undressing Lower body dressing      What is the patient wearing?: Underwear/pull up     Lower body assist Assist for lower body dressing: Total Assistance - Patient < 25%     Toileting Toileting    Toileting assist Assist for toileting: Moderate Assistance - Patient 50 - 74%     Transfers Chair/bed transfer  Transfers assist     Chair/bed transfer assist level: Contact Guard/Touching assist     Locomotion Ambulation   Ambulation assist      Assist level: Contact Guard/Touching assist Assistive device: Walker-rolling Max distance: 300 ft   Walk 10 feet activity   Assist     Assist level: Contact Guard/Touching assist Assistive device: Walker-rolling   Walk 50  feet activity   Assist Walk 50 feet with 2 turns activity did not occur: Safety/medical concerns  Assist level: Contact Guard/Touching assist Assistive device: Walker-rolling    Walk 150 feet activity   Assist Walk 150 feet activity did not occur: Safety/medical concerns  Assist level: Contact Guard/Touching assist Assistive device: Walker-rolling    Walk 10 feet on uneven surface  activity   Assist Walk 10 feet on uneven surfaces activity did not occur: Safety/medical concerns         Wheelchair     Assist Is the patient using a wheelchair?: Yes Type of Wheelchair: Manual    Wheelchair  assist level: Minimal Assistance - Patient > 75% Max wheelchair distance: 174ft    Wheelchair 50 feet with 2 turns activity    Assist        Assist Level: Minimal Assistance - Patient > 75%   Wheelchair 150 feet activity     Assist      Assist Level: Moderate Assistance - Patient 50 - 74%   Blood pressure (!) 117/56, pulse (!) 57, temperature 98 F (36.7 C), temperature source Oral, resp. rate 14, height 5\' 5"  (1.651 m), weight 119.8 kg, SpO2 95 %.  Medical Problem List and Plan: 1.  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  Impaired mobility, ambulating 20 feet: continue lovenox.             -antiplatelet therapy: Aspirin 81 mg daily and Plavix 75 mg day x3 weeks total then aspirin alone 3. Pain Management: Neurontin 300 mg daily  12/11- will change to Lyrica 75 mg BID since her Cr will tolerate; not allergic and Gabapentin not working for her.  4. Mood: Trazodone 50 mg nightly, Cymbalta 30 mg daily Xanax as needed             -antipsychotic agents: N/A 5. Neuropsych: This patient is capable of making decisions on her own behalf. 6. Skin/Wound Care: Routine skin checks 7. Fluids/Electrolytes/Nutrition: Routine in and outs with follow-up chemistries 8.  Hypertension.  HCTZ 12.5 mg daily, Norvasc 10 mg daily, Inderal 10 mg twice daily.  Monitor with increased mobility  12/11- BP controlled- con't regimen 9.  History of AVM with surgery 2015.  Follow-up outpatient 10.  Seizure disorder.  Continue Lamictal 100 mg nightly 11.  Hyperlipidemia.  Lipitor 12.  Tobacco abuse.  Provide counseling, we discussed that this is the most important factor in  preventing recurrent stroke  13.  Obesity.  BMI 43.93.  Dietary follow-up 14.Insomnia/constipation/hypertension/anxiety: start magnesium gluconate 250mg   HS  15.  Epigastric discomfort with swallowing start protonix and monitor , improving 16. Depressed  Neuropsych eval ordered  LOS: 6 days A FACE TO FACE EVALUATION WAS PERFORMED  2016 12/02/2021, 7:27 AM

## 2021-12-02 NOTE — Plan of Care (Signed)
  Problem: RH Wheelchair Mobility Goal: LTG Patient will propel w/c in controlled environment (PT) Description: LTG: Patient will propel wheelchair in controlled environment, # of feet with assist (PT) Outcome: Not Applicable Note: Pt is improving in ambulation and will not require mobility via w/c.

## 2021-12-02 NOTE — Plan of Care (Signed)
°  Problem: RH Bathing Goal: LTG Patient will bathe all body parts with assist levels (OT) Description: LTG: Patient will bathe all body parts with assist levels (OT) Flowsheets (Taken 12/02/2021 1251) LTG: Pt will perform bathing with assistance level/cueing: (upgraded due to progress) Supervision/Verbal cueing LTG: Position pt will perform bathing: At sink Note: upgraded due to progress   Problem: RH Dressing Goal: LTG Patient will perform lower body dressing w/assist (OT) Description: LTG: Patient will perform lower body dressing with assist, with/without cues in positioning using equipment (OT) Flowsheets (Taken 12/02/2021 1251) LTG: Pt will perform lower body dressing with assistance level of: (upgraded due to progress) Set up assist Note: upgraded due to progress   Problem: RH Toileting Goal: LTG Patient will perform toileting task (3/3 steps) with assistance level (OT) Description: LTG: Patient will perform toileting task (3/3 steps) with assistance level (OT)  Flowsheets (Taken 12/02/2021 1251) LTG: Pt will perform toileting task (3/3 steps) with assistance level: (upgraded due to progress) Supervision/Verbal cueing Note: upgraded due to progress   Problem: RH Tub/Shower Transfers Goal: LTG Patient will perform tub/shower transfers w/assist (OT) Description: LTG: Patient will perform tub/shower transfers with assist, with/without cues using equipment (OT) Flowsheets (Taken 12/02/2021 1251) LTG: Pt will perform tub/shower stall transfers with assistance level of: (upgraded due to progress) Contact Guard/Touching assist LTG: Pt will perform tub/shower transfers from: Tub/shower combination Note: upgraded due to progress

## 2021-12-02 NOTE — Progress Notes (Signed)
Occupational Therapy Session Note  Patient Details  Name: Kristin Burgess MRN: [id number] Date of Birth: 08-03-1961  Today's Date: 12/02/2021 OT Individual Time: 1101-1154 OT Individual Time Calculation (min): 53 min    Short Term Goals: Week 1:  OT Short Term Goal 1 (Week 1): Pt will don pants with max A and AE PRN. OT Short Term Goal 2 (Week 1): Pt will complete 1/3 steps of toileting with S. OT Short Term Goal 3 (Week 1): Pt will complete toilet transfer with min A + LRAD.  Skilled Therapeutic Interventions/Progress Updates:  Pt greeted seated in recliner  agreeable to OT intervention. Session focus on various therapeutic activities focused on dynamic standing balance, visual deficits, problem solving and functional mobility. Pt completed sit<>stand from recliner with rollator with supervision, ambulatory transfer from room>gym with gross supervision. Worked on dynamic standing balance, problem solving and LUE FMC via therapeutic activity where pt stood on airex mat and utilized BUEs to create leggo structure from visual aid provided. Pt completed task with CGA in 4 mins and 52 secs with pt completing level 1 structure with no cues and level 2 structure with MOD verbal cues  before needing to sit. pt completed level 3 structure from sitting in chair with MAX verbal cues to problem solve structure. Pt seems to have visual scanning difficulties as pt noted to build structure up>down vs L>R. Pt does endorse blurry vision ( encouraged pt to see eye doctor at [place]) and does agree that she often neglects scanning L>R. Education provided on compensatory methods as well as working on word searches to practice visual scanning. Pt additionally completed 9 hole peg test to assess LUE [facility] with results as follows:  RUE: 27 secs LUE: 36 secs. Pt requested to complete assessment again for practice with pt receiving below scores: RUE 24 secs and LUE: 32 secs    Further worked on LUE coordination with pt completing  FMC task where pt instructed to rearrange ping pong balls in egg crate to match visual aid, pt again needed MOD verbal cues for problem solving arrangement of balls. Pt completed functional mobility to room with supervision with rollator.   pt left seated in recliner with chair alarm activated and all needs within reach.                  Therapy Documentation Precautions:  Precautions Precautions: Fall Precaution Comments: mild L hemi, chronic RLE pain Restrictions Weight Bearing Restrictions: No  Pain: no pain reported during session     Therapy/Group: Individual Therapy  Barron Schmid 12/02/2021, 12:19 PM

## 2021-12-02 NOTE — Progress Notes (Signed)
Occupational Therapy Session Note  Patient Details  Name: Kristin Burgess MRN: [id number] Date of Birth: 1961/03/24 Today's Date: 12/02/2021 OT Individual Time: 0569-7948 OT Individual Time Calculation (min): 53 min    Short Term Goals: Week 1:  OT Short Term Goal 1 (Week 1): Pt will don pants with max A and AE PRN. OT Short Term Goal 2 (Week 1): Pt will complete 1/3 steps of toileting with S. OT Short Term Goal 3 (Week 1): Pt will complete toilet transfer with min A + LRAD.  Skilled Therapeutic Interventions/Progress Updates:    Pt received seated in recliner with RN present, denies pain and, agreeable to therapy. Session focus on self-care retraining, activity tolerance, transfer retraining, IADL retraining, DME needs in prep for improved ADL/IADL/func mobility performance + decreased caregiver burden. Reports having completed ADL/sink side bathing this morning at sink with set-up A from  nursing. Able to doff/don B socks/shoes with S from therapist. Complete ambulatory toilet transfer, as well with RW and CGA due to intermittent BLE soreness. Ambulated to and from ADL apartment in similar manner.   Discussed DME needs upon DC. Pt will need 3in1, TTB, and reports her rollater is broken. Completed TTB transfer into bathroom with CGA and cues for BLE management + use of LUE on grab bar. Pt will be able to request grab bar/ hand held shower installation from rental management company for her bathroom to improve bathroom accessibility.   In kitchen, pt able to gather required items and cook box mac n cheese with overall CGA and RW. Required intermittent min assist for RW management and min cues for safe item transport and to sit down for energy conservation. Reports back pain with bending over counter, reviewed avoiding unnecessary bending and twist to decrease back pain and pt verbalized understanding.   Total A for kitchen clean up due to time management and pt fatigue.  Completed ambulatory  transfer back to recliner in same manner as above.  Pt left seated in recliner with chair alarm engaged, call bell in reach, and all immediate needs met.    Therapy Documentation Precautions:  Precautions Precautions: Fall Precaution Comments: mild L hemi, chronic RLE pain Restrictions Weight Bearing Restrictions: No  Pain: denies   ADL: See Care Tool for more details.  Therapy/Group: Individual Therapy  Volanda Napoleon MS, OTR/L  12/02/2021, 6:50 AM

## 2021-12-02 NOTE — Progress Notes (Signed)
Physical Therapy Session Note  Patient Details  Name: Kristin Burgess MRN: [id number] Date of Birth: 1961/09/21  Today's Date: 12/02/2021 PT Individual Time: 1030-1054 and 3710-6269 PT Individual Time Calculation (min): 24 min and 69 min  Short Term Goals: Week 1:  PT Short Term Goal 1 (Week 1): Pt will perform bed mobiltiy with min assist consistnetly PT Short Term Goal 2 (Week 1): Pt will transfer to [facility] with min assist and LRAD PT Short Term Goal 3 (Week 1): Pt will ambulate 29ft with  min assist and LRAD PT Short Term Goal 4 (Week 1): Pt will ascend 4 steps with BUE support and mod assist   Skilled Therapeutic Interventions/Progress Updates:  Patient reclined with BLE elevated in recliner on entrance to room. Easily roused. Patient alert and agreeable to PT session.   Patient with no pain complaint throughout session but does c/o muscle cramping in R calf when stretching and pointing toe.   Therapeutic Activity: Transfers: Patient performed sit<>stand and stand pivot transfers throughout session with supervision. Provided verbal cues for safe hand progression/ positioning.  Gait Training:  Pt has rollator at home, but it is currently broken. Rollator brought to room for gait training. Patient ambulated 150 ft using rollator with CGA improving to supervision. Provided vc/ tc for maintaining decreased downward pressure into walker for improved safety, use of brakes prior to any transition in transfers.  Session ending a few minutes early d/t arrival of representative from pt's leasing agency for home. Representative present in order to get recommendations from pt about safety/ ADA additions required for pt's home and signing of paperwork.   Patient seated upright in w/c at end of session with brakes locked, seat pad alarm set, and all needs within reach.  Session 2: Patient seated upright in recliner on entrance to room. Patient alert and agreeable to PT session.   Patient with no  pain complaint throughout session.  Therapeutic Activity: Transfers: Patient performed sit<>stand and stand pivot transfers throughout session with supervision. Focus on safe brake application to rollator prior to transfers, decreased use of hands. Provided verbal cues for safety and technique.  Gait Training:  Patient ambulated >300 feet x2  using rollator with supervision/ CGA as well as >150 feet around therapy gym with no AD and CGA. Pt guided in safety awareness training with collection of weighted balls placed around dayroom requiring safe negotiation of approach and reach for ball. Performed without AD and CGA. Demonstrated good awareness and safe approaches with minimal vc required for environmental awareness.  Neuromuscular Re-ed: NMR facilitated during session with focus on balance. Pt guided in bean bag toss to target. Is able to demonstrate good balance and technique with dynamic step and coordinated arm swing. Is able to hit target with 5/11 bags on first attempt and slight increase in distance hitting 6/ 11 targets on 2nd attempt. One minor LOB to pt's L side with pt self correcting with appropriate reactive stepping strategy.   Pt also guided in aspects of DGI in order to work on higher level balance/ ambulation. Also taken through obstacle course including 6" hurdles, walking up low foam wedge, walking around cones in "S" pattern and onto/ off Airex pad stopping to perform self perturbations with large arm movements.   NMR performed for improvements in motor control and coordination, balance, sequencing, judgement, and self confidence/ efficacy in performing all aspects of mobility at highest level of independence.   Patient seated in recliner at end of session with brakes locked,  seat pad alarm set, and all needs within reach.  Therapy Documentation Precautions:  Precautions Precautions: Fall Precaution Comments: mild L hemi, chronic RLE pain Restrictions Weight Bearing  Restrictions: No General:   Pain: Pain Assessment Pain Scale: 0-10 Pain Score: 0-No pain Faces Pain Scale: No hurt  Therapy/Group: Individual Therapy  Loel Dubonnet PT, DPT 12/02/2021, 12:33 PM

## 2021-12-03 LAB — CREATININE, SERUM
Creatinine, Ser: 0.86 mg/dL (ref 0.44–1.00)
GFR, Estimated: >60 mL/min (ref >60)

## 2021-12-03 NOTE — Progress Notes (Signed)
Patient ID: Kristin Burgess, female   DOB: 30-Jan-1961, 60 y.o.   MRN: [id number]  Rollator, BSC & TTB ordered through [employer].

## 2021-12-03 NOTE — Progress Notes (Signed)
PROGRESS NOTE   Subjective/Complaints: Doing  hygiene at sink. Had a good day in therapy, good BM this am , discussed conf day  ROS-  Pt denies SOB, abd pain, CP, N/V/C/D, and vision changes   Objective:   No results found. No results for input(s): WBC, HGB, HCT, PLT in the last 72 hours. Recent Labs    12/03/21 0526  CREATININE 0.86    Intake/Output Summary (Last 24 hours) at 12/03/2021 0809 Last data filed at 12/03/2021 0700 Gross per 24 hour  Intake 820 ml  Output --  Net 820 ml         Physical Exam: Vital Signs Blood pressure 139/64, pulse 60, temperature 98.6 F (37 C), temperature source Oral, resp. rate 19, height _0  (1.651 m), weight 119.8 kg, SpO2 100 %.   General: No acute distress Mood and affect are appropriate Heart: Regular rate and rhythm no rubs murmurs or extra sounds Lungs: Clear to auscultation, breathing unlabored, no rales or wheezes Abdomen: Positive bowel sounds, soft nontender to palpation, nondistended Extremities: No clubbing, cyanosis, or edema Skin: No evidence of breakdown, no evidence of rash  Neurologic: Cranial nerves II through XII intact, motor strength is 5/5 in bilateral deltoid, bicep, tricep, grip, hip flexor, knee extensors, ankle dorsiflexor and plantar flexor Sensory exam normal sensation to light touch and proprioception in bilateral upper and lower extremities Cerebellar sithout dysmetria   Musculoskeletal: Full range of motion in all 4 extremities. No joint swelling   Assessment/Plan: 1. Functional deficits which require 3+ hours per day of interdisciplinary therapy in a comprehensive inpatient rehab setting. Physiatrist is providing close team supervision and 24 hour management of active medical problems listed below. Physiatrist and rehab team continue to assess barriers to discharge/monitor patient progress toward functional and medical goals  Care  Tool:  Bathing    Body parts bathed by patient: Right arm, Left arm, Chest, Abdomen, Front perineal area, Left upper leg, Right upper leg, Face   Body parts bathed by helper: Buttocks, Right lower leg, Left lower leg     Bathing assist Assist Level: Moderate Assistance - Patient 50 - 74%     Upper Body Dressing/Undressing Upper body dressing   What is the patient wearing?: Pull over shirt    Upper body assist Assist Level: Supervision/Verbal cueing    Lower Body Dressing/Undressing Lower body dressing      What is the patient wearing?: Underwear/pull up     Lower body assist Assist for lower body dressing: Total Assistance - Patient < 25%     Toileting Toileting    Toileting assist Assist for toileting: Moderate Assistance - Patient 50 - 74%     Transfers Chair/bed transfer  Transfers assist     Chair/bed transfer assist level: Supervision/Verbal cueing     Locomotion Ambulation   Ambulation assist      Assist level: Contact Guard/Touching assist Assistive device: Walker-rolling Max distance: 300 ft   Walk 10 feet activity   Assist     Assist level: Contact Guard/Touching assist Assistive device: Walker-rolling   Walk 50 feet activity   Assist Walk 50 feet with 2 turns activity did not occur: Safety/medical  concerns  Assist level: Contact Guard/Touching assist Assistive device: Walker-rolling    Walk 150 feet activity   Assist Walk 150 feet activity did not occur: Safety/medical concerns  Assist level: Contact Guard/Touching assist Assistive device: Walker-rolling    Walk 10 feet on uneven surface  activity   Assist Walk 10 feet on uneven surfaces activity did not occur: Safety/medical concerns         Wheelchair     Assist Is the patient using a wheelchair?: Yes Type of Wheelchair: Manual    Wheelchair assist level: Minimal Assistance - Patient > 75% Max wheelchair distance: 163f    Wheelchair 50 feet with 2  turns activity    Assist        Assist Level: Minimal Assistance - Patient > 75%   Wheelchair 150 feet activity     Assist      Assist Level: Moderate Assistance - Patient 50 - 74%   Blood pressure 139/64, pulse 60, temperature 98.6 F (37 C), temperature source Oral, resp. rate 19, height _0  (1.651 m), weight 119.8 kg, SpO2 100 %.  Medical Problem List and Plan: 1.  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 Formulized individual treatment plan based on medical history, underlying problem and comorbidities.   Continue CIR- PT, OT and SLP  2.  Impaired mobility, ambulating 20 feet: continue lovenox.             -antiplatelet therapy: Aspirin 81 mg daily and Plavix 75 mg day x3 weeks total then aspirin alone 3. Pain Management: Neurontin 300 mg daily  12/11- will change to Lyrica 75 mg BID since her Cr will tolerate; not allergic and Gabapentin not working for her.  4. Mood: Trazodone 50 mg nightly, Cymbalta 30 mg daily Xanax as needed             -antipsychotic agents: N/A 5. Neuropsych: This patient is capable of making decisions on her own behalf. 6. Skin/Wound Care: Routine skin checks 7. Fluids/Electrolytes/Nutrition: Routine in and outs with follow-up chemistries 8.  Hypertension.  HCTZ 12.5 mg daily, Norvasc 10 mg daily, Inderal 10 mg twice daily.  Monitor with increased mobility  12/11- BP controlled- con't regimen 9.  History of AVM with surgery 2015.  Follow-up outpatient 10.  Seizure disorder.  Continue Lamictal 100 mg nightly 11.  Hyperlipidemia.   Lipitor 12.  Tobacco abuse.  Provide counseling, we discussed that this is the most important factor in preventing recurrent stroke  13.  Obesity.  BMI 43.93.  Dietary follow-up 14.Insomnia/constipation/hypertension/anxiety: start magnesium gluconate 2583m HS  15.  Epigastric discomfort with swallowing start protonix and monitor , improving 16. Depressed  Neuropsych eval ordered  LOS: 7 days A FACE TO FACE EVALUATION WAS PERFORMED  AnCharlett Blake2/14/2022, 8:09 AM

## 2021-12-03 NOTE — Progress Notes (Signed)
Occupational Therapy Discharge Summary  Patient Details  Name: Kristin Burgess MRN: [id number] Date of Birth: [birth year]   Patient has met 10 of 10 long term goals due to improved activity tolerance, improved balance, postural control, ability to compensate for deficits, functional use of  LEFT upper extremity, improved attention, improved awareness, and improved coordination.  Patient to discharge at overall Modified Independent level.  Patient's care partner is independent to provide the necessary physical assistance at discharge.    Reasons goals not met: NA  Recommendation:  Patient will benefit from ongoing skilled OT services in outpatient setting to continue to advance functional skills in the area of BADL, iADL, Vocation, and Reduce care partner burden.  Equipment: 3in1, TTB  Reasons for discharge: treatment goals met and discharge from hospital  Patient/family agrees with progress made and goals achieved: Yes  OT Discharge Precautions/Restrictions  Precautions Precautions: Fall Precaution Comments: mild L hemi, chronic RLE pain Restrictions Weight Bearing Restrictions: No  ADL ADL Eating: Independent Where Assessed-Eating: Bed level, Chair, Wheelchair Grooming: Modified independent Where Assessed-Grooming: Sitting at sink Upper Body Bathing: Modified independent Where Assessed-Upper Body Bathing: Sitting at sink Lower Body Bathing: Modified independent Where Assessed-Lower Body Bathing: Standing at sink Upper Body Dressing: Independent Where Assessed-Upper Body Dressing: Sitting at sink Lower Body Dressing: Modified independent Where Assessed-Lower Body Dressing: Standing at sink Toileting: Modified independent Where Assessed-Toileting: Toilet, Bedside Commode Toilet Transfer: Modified independent Armed forces technical officer Method: Counselling psychologist: Raised toilet seat, Bedside commode Tub/Shower Transfer: Metallurgist Method:  Optometrist: Facilities manager: Not assessed Vision Baseline Vision/History: 1 Wears glasses Patient Visual Report: Blurring of vision Vision Assessment?: Yes Eye Alignment: Within Functional Limits Ocular Range of Motion: Within Functional Limits Alignment/Gaze Preference: Within Defined Limits Visual Fields: No apparent deficits Perception  Perception: Within Functional Limits Praxis Praxis: Intact Cognition Overall Cognitive Status: Within Functional Limits for tasks assessed Arousal/Alertness: Awake/alert Orientation Level: Oriented X4 Year: 2022 Month: December Day of Week: Correct Memory: Appears intact Immediate Memory Recall: Sock;Blue;Bed Memory Recall Sock: Without Cue Memory Recall Blue: Without Cue Memory Recall Bed: Without Cue Awareness: Appears intact Problem Solving: Appears intact Safety/Judgment: Appears intact Sensation Sensation Light Touch: Appears Intact Hot/Cold: Appears Intact Proprioception: Appears Intact Stereognosis: Appears Intact Coordination Gross Motor Movements are Fluid and Coordinated: No Fine Motor Movements are Fluid and Coordinated: Yes Coordination and Movement Description: limited by chronic back pain, generalized deconditioning, much improved from eval 9 Hole Peg Test: R: 29 seconds, L: 37 seconds Motor  Motor Motor: Hemiplegia;Ataxia Motor - Discharge Observations: Slight R hemi LE>UE Mobility  Bed Mobility Bed Mobility: Rolling Right;Rolling Left;Supine to Sit;Sit to Supine Rolling Right: Independent Rolling Left: Independent Supine to Sit: Independent Sit to Supine: Independent Transfers Sit to Stand: Independent with assistive device Stand to Sit: Independent with assistive device  Trunk/Postural Assessment  Cervical Assessment Cervical Assessment: Within Functional Limits Thoracic Assessment Thoracic Assessment: Exceptions to [facility] (rounded shoulders) Lumbar  Assessment Lumbar Assessment: Exceptions to [facility] (posterior pelvic tilt in sitting) Postural Control Postural Control: Deficits on evaluation (BUE support reliance)  Balance Balance Balance Assessed: Yes Static Sitting Balance Static Sitting - Balance Support: Feet supported Static Sitting - Level of Assistance: 6: Modified independent (Device/Increase time) Dynamic Sitting Balance Dynamic Sitting - Balance Support: Feet supported Dynamic Sitting - Level of Assistance: 6: Modified independent (Device/Increase time) Static Standing Balance Static Standing - Balance Support: Bilateral upper extremity supported;During functional activity Static Standing - Level of Assistance: 6:  Modified independent (Device/Increase time) Dynamic Standing Balance Dynamic Standing - Balance Support: Bilateral upper extremity supported;During functional activity Dynamic Standing - Level of Assistance: 5: Stand by assistance Extremity/Trunk Assessment RUE Assessment RUE Assessment: Within Functional Limits General Strength Comments: 5/5 in shoulder flexion , 34.5 grip strength (lbs)     Volanda Napoleon MS, OTR/L  12/03/2021, 12:48 PM

## 2021-12-03 NOTE — Discharge Summary (Signed)
Physician Discharge Summary  Patient ID: Kristin Burgess: [id number] DOB/AGE: 59-Jan-1962 59 y.o.  Admit date: 11/26/2021 Discharge date: 12/05/2021  Discharge Diagnoses:  Principal Problem:   Acute bilat watershed infarction [facility]) Hypertension History of AVM Seizure disorder Hyperlipidemia Tobacco use Obesity  Discharged Condition: Stable  Significant Diagnostic Studies: CT ANGIO HEAD NECK W WO CM  Result Date: 11/22/2021 CLINICAL DATA:  CVA EXAM: CT ANGIOGRAPHY HEAD AND NECK TECHNIQUE: Multidetector CT imaging of the head and neck was performed using the standard protocol during bolus administration of intravenous contrast. Multiplanar CT image reconstructions and MIPs were obtained to evaluate the vascular anatomy. Carotid stenosis measurements (when applicable) are obtained utilizing NASCET criteria, using the distal internal carotid diameter as the denominator. CONTRAST:  73mL OMNIPAQUE IOHEXOL 350 MG/ML SOLN COMPARISON:  February 2018 disease FINDINGS: CTA NECK Aortic arch: Great vessel origins are patent. Right carotid system: Patent. Primarily calcified plaque at the bifurcation and proximal internal carotid without stenosis. Left carotid system: Patent. Primarily calcified plaque at the bifurcation and proximal internal carotid without stenosis. Vertebral arteries: Patent.  Codominant.  No stenosis. Skeleton: Cervical spine degenerative changes. Other neck: Unremarkable. Upper chest: No apical lung mass. Review of the MIP images confirms the above findings CTA HEAD Anterior circulation: Intracranial internal carotid arteries are patent with mild calcified plaque. There are two small (1-2 mm) inferiorly directed outpouchings from the distal supraclinoid right ICA (series 11, image 25). The more proximal may be contiguous with a small vessel. Anterior cerebral arteries are patent. Anterior communicating artery is present. New focal marked stenosis of the azygous left A2 ACA at the level  of the genu of the corpus callosum (series 11, image 26). Middle cerebral arteries are patent. Posterior circulation: Intracranial vertebral arteries are patent with mild calcified plaque. Basilar artery is patent. Major cerebellar artery origins are patent. No evidence of recurrent AVM. Venous sinuses: Patent as allowed by contrast bolus timing. Review of the MIP images confirms the above findings IMPRESSION: No large vessel occlusion.  No significant stenosis in the neck. 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 No evidence of recurrent AVM. Two small inferiorly directed outpouchings from the distal supraclinoid right ICA reflecting infundibula or aneurysms. Electronically Signed   By: Guadlupe Spanish M.D.   On: 11/22/2021 12:50   DG Lumbar Spine Complete  Result Date: 11/22/2021 CLINICAL DATA:  Right leg weakness. EXAM: LUMBAR SPINE - COMPLETE 4+ VIEW COMPARISON:  CT 04/11/2021 FINDINGS: Alignment of the lumbar spine is within normal limits. The vertebral body heights and disc spaces are maintained. Multilevel degenerative endplate changes. Negative for fracture. Negative for a pars defect. IMPRESSION: No acute abnormality in lumbar spine. Electronically Signed   By: Richarda Overlie M.D.   On: 11/22/2021 11:52   CT HEAD WO CONTRAST ( )  Result Date: 11/22/2021 CLINICAL DATA:  Weakness, rule out stroke EXAM: CT HEAD WITHOUT CONTRAST TECHNIQUE: Contiguous axial images were obtained from the base of the skull through the vertex without intravenous contrast. COMPARISON:  02/09/2017 FINDINGS: Brain: No definite evidence of acute infarction, hemorrhage, hydrocephalus, extra-axial collection or mass lesion/mass effect. Unchanged encephalomalacia of the right parietal vertex (series 2, image 26). There is new, subtle hypodensity of the deep white matter in the left parietal lobe (series 2, image 22). Vascular: No hyperdense vessel or unexpected  calcification. Skull: Normal. Negative for fracture or focal lesion. Sinuses/Orbits: No acute finding. Other: None. IMPRESSION: 1. There  is new, subtle hypodensity of the deep white matter in the left parietal lobe, consistent with infarction although age indeterminate, most likely subacute to chronic. Consider MRI to more sensitively evaluate for acute diffusion restricting infarction if suspected. 2. Unchanged nonacute encephalomalacia of the right parietal vertex. Electronically Signed   By: Jearld Lesch M.D.   On: 11/22/2021 11:24   MR BRAIN WO CONTRAST  Result Date: 11/22/2021 CLINICAL DATA:  Neuro deficit, acute, stroke suspected EXAM: MRI HEAD WITHOUT CONTRAST TECHNIQUE: Multiplanar, multiecho pulse sequences of the brain and surrounding structures were obtained without intravenous contrast. COMPARISON:  None. FINDINGS: Motion artifact is present. Brain: Few scattered small foci of restricted diffusion are present in the parasagittal left frontoparietal lobes. Small focus also present within the right frontal lobe. Chronic infarct of the right parietal lobe with chronic blood products. Additional patchy T2 hyperintensity in the supratentorial white matter is nonspecific but may reflect chronic microvascular ischemic changes. Ventricles and sulci are within normal limits in size and configuration. Vascular: Major vessel flow voids at the skull base are preserved. Skull and upper cervical spine: Normal marrow signal is preserved. Sinuses/Orbits: Paranasal sinuses are aerated. Orbits are unremarkable. Other: Sella is unremarkable.  Mastoid air cells are clear. IMPRESSION: Scattered small acute infarcts in the parasagittal left frontoparietal lobes much greater than right frontal lobe probably representing ACA and ACA/MCA watershed infarcts in the setting of azygous A2 ACA stenosis seen on CTA. Chronic right parietal infarct. Chronic microvascular ischemic changes. Electronically Signed   By: Guadlupe Spanish  M.D.   On: 11/22/2021 15:51   ECHOCARDIOGRAM COMPLETE  Result Date: 11/23/2021    ECHOCARDIOGRAM REPORT   Patient Name:   BRIDGIT EYNON Date of Exam: 11/23/2021 Medical Rec #:  [id number]    Height:       65.0 in Accession #:    [id number]   Weight:       264.0 lb Date of Birth:  05/07/1961    BSA:          2.226 m Patient Age:    60 years     BP:           153/76 mmHg Patient Gender: F            HR:           70 bpm. Exam Location:  [facility] Procedure: 2D Echo Indications:     Stroke  History:         Patient has no prior history of Echocardiogram examinations.                  Risk Factors:Hypertension, Current Smoker and Morbidly Obese.  Sonographer:     L Thornton-Maynard Referring Phys:  [id number] Mary Sella ECKSTAT Diagnosing Phys: Julien Nordmann MD  Sonographer Comments: Suboptimal subcostal window. IMPRESSIONS  1. Left ventricular ejection fraction, by estimation, is 60 to 65%. The left ventricle has normal function. The left ventricle has no regional wall motion abnormalities. There is moderate left ventricular hypertrophy. Left ventricular diastolic parameters are consistent with Grade I diastolic dysfunction (impaired relaxation).  2. Right ventricular systolic function is normal. The right ventricular size is normal. Tricuspid regurgitation signal is inadequate for assessing PA pressure.  3. Left atrial size was mildly dilated.  4. A small pericardial effusion is present.  5. The mitral valve is normal in structure. Mild mitral valve regurgitation. No evidence of mitral stenosis.  6. The aortic valve is normal in structure. Aortic valve regurgitation is not visualized.  No aortic stenosis is present.  7. The inferior vena cava is normal in size with greater than 50% respiratory variability, suggesting right atrial pressure of 3 mmHg. FINDINGS  Left Ventricle: Left ventricular ejection fraction, by estimation, is 60 to 65%. The left ventricle has normal function. The left ventricle has no regional wall motion  abnormalities. The left ventricular internal cavity size was normal in size. There is  moderate left ventricular hypertrophy. Left ventricular diastolic parameters are consistent with Grade I diastolic dysfunction (impaired relaxation). Right Ventricle: The right ventricular size is normal. No increase in right ventricular wall thickness. Right ventricular systolic function is normal. Tricuspid regurgitation signal is inadequate for assessing PA pressure. Left Atrium: Left atrial size was mildly dilated. Right Atrium: Right atrial size was normal in size. Pericardium: A small pericardial effusion is present. Mitral Valve: The mitral valve is normal in structure. Mild mitral valve regurgitation. No evidence of mitral valve stenosis. Tricuspid Valve: The tricuspid valve is normal in structure. Tricuspid valve regurgitation is not demonstrated. No evidence of tricuspid stenosis. Aortic Valve: The aortic valve is normal in structure. Aortic valve regurgitation is not visualized. No aortic stenosis is present. Aortic valve peak gradient measures 6.1 mmHg. Pulmonic Valve: The pulmonic valve was normal in structure. Pulmonic valve regurgitation is not visualized. No evidence of pulmonic stenosis. Aorta: The aortic root is normal in size and structure. Venous: The inferior vena cava is normal in size with greater than 50% respiratory variability, suggesting right atrial pressure of 3 mmHg. IAS/Shunts: No atrial level shunt detected by color flow Doppler.  LEFT VENTRICLE PLAX 2D LVIDd:         4.90 cm   Diastology LVIDs:         3.00 cm   LV e' medial:    5.22 cm/s LV PW:         1.40 cm   LV E/e' medial:  14.4 LV IVS:        1.80 cm   LV e' lateral:   5.33 cm/s LVOT diam:     2.00 cm   LV E/e' lateral: 14.1 LV SV:         58 LV SV Index:   26 LVOT Area:     3.14 cm  RIGHT VENTRICLE RV S prime:     16.00 cm/s TAPSE (M-mode): 1.8 cm LEFT ATRIUM             Index LA diam:        4.30 cm 1.93 cm/m LA Vol (A2C):   69.1 ml  31.04 ml/m LA Vol (A4C):   84.7 ml 38.05 ml/m LA Biplane Vol: 76.9 ml 34.55 ml/m  AORTIC VALVE                 PULMONIC VALVE AV Area (Vmax): 1.82 cm     PV Vmax:          1.14 m/s AV Vmax:        123.00 cm/s  PV Peak grad:     5.2 mmHg AV Peak Grad:   6.1 mmHg     PR End Diast Vel: 1.43 msec LVOT Vmax:      71.10 cm/s LVOT Vmean:     57.100 cm/s LVOT VTI:       0.185 m  AORTA Ao Root diam: 3.20 cm Ao Asc diam:  3.30 cm MITRAL VALVE MV Area (PHT): 2.86 cm    SHUNTS MV E velocity: 75.00 cm/s  Systemic VTI:  0.18  m MV A velocity: 90.80 cm/s  Systemic Diam: 2.00 cm MV E/A ratio:  0.83 Julien Nordmann MD Electronically signed by Julien Nordmann MD Signature Date/Time: 11/23/2021/2:00:01 PM    Final    DG Hip Unilat W or Wo Pelvis 2-3 Views Right  Result Date: 11/22/2021 CLINICAL DATA:  Right leg weakness. EXAM: DG HIP (WITH OR WITHOUT PELVIS) 2-3V RIGHT COMPARISON:  None. FINDINGS: Pelvic bony ring is intact. Symmetric appearance of the SI joints. Normal appearance of left hip. Right hip is located without a fracture. No significant joint space narrowing in the hips. IMPRESSION: No acute abnormality. Electronically Signed   By: Richarda Overlie M.D.   On: 11/22/2021 11:50    Labs:  Basic Metabolic Panel: Recent Labs  Lab 12/03/21 0526  CREATININE 0.86    CBC: No results for input(s): WBC, NEUTROABS, HGB, HCT, MCV, PLT in the last 168 hours.   CBG: No results for input(s): GLUCAP in the last 168 hours.  Family history.  Paternal grandmother with lung cancer as well as colon cancer.  Negative kidney cancer bladder cancer or stomach cancer  Brief HPI:   Kristin Burgess is a 60 y.o. right-handed female with history of hypertension tobacco use AVM with surgery 2015 seizure disorder morbid obesity BMI 39.93.  Per chart review lives alone modified independent for ambulation.  Family and friends assist with ADLs.  Presented to [facility] 11/22/2021 with progressive right leg weakness overnight.  As well as recent fall  when getting out of bed without loss of consciousness.  She denied any lightheadedness or syncope.  Cranial CT scan showed subtle hypodensity of the deep white matter of the left parietal lobe consistent with infarction although age-indeterminate.  Unchanged nonacute encephalomalacia of the right parietal vertex.  CT angiogram head and neck no large vessel occlusion or stenosis.  No evidence of recurrent AVM.  MRI scattered small acute infarcts in the parasagittal left frontal parietal lobe much greater than right frontal lobe representing ACA MCA watershed infarcts.  Patient did not receive tPA.  Echocardiogram with ejection fraction of 60 to 65% no wall motion abnormalities grade 1 diastolic dysfunction.  Admission chemistries unremarkable glucose 107 alcohol negative hemoglobin A1c 5.9.  Neurology follow-up maintained on aspirin and Plavix for 3 weeks and aspirin alone.  Subcutaneous Lovenox for DVT prophylaxis.  Tolerating a regular diet.  Therapy evaluations completed due to patient decreased functional mobility was admitted for a comprehensive rehab program.   Hospital Course: Kristin Burgess was admitted to rehab 11/26/2021 for inpatient therapies to consist of PT, ST and OT at least three hours five days a week. Past admission physiatrist, therapy team and rehab RN have worked together to provide customized collaborative inpatient rehab.  Pertaining to patient's scattered acute infarct parasagittal left frontal parietal lobe much greater and right frontal lobe representing ACA/MCA watershed infarcts mainly with ACA distribution.  Patient would continue aspirin Plavix x3 weeks total and aspirin alone.  Pain managed with use of Neurontin transitioned to Lyrica.  Lovenox for DVT prophylaxis no bleeding episodes.  Blood pressure controlled on monitor and HCTZ as well as Norvasc and Inderal will need outpatient follow-up.  History of AVM surgery 2015 follow-up outpatient MRI showed no recurrence of AVM.  History  of seizure disorder maintained on Lamictal.  She was using trazodone to help aid in sleep.  Noted obesity BMI 43.93 dietary follow-up.   Blood pressures were monitored on TID basis and controlled     Rehab course: During patient's stay in  rehab weekly team conferences were held to monitor patient's progress, set goals and discuss barriers to discharge. At admission, patient required max assist stand pivot transfers min mod assist 5 feet rolling walker  Physical exam.  Blood pressure 134/70 pulse 65 temperature 98.1 respiration 16 oxygen saturation 92% room air Constitutional.  No acute distress HEENT Head.  Normocephalic and atraumatic Eyes.  Pupils round and reactive to light no discharge.nystagmus Neck.  Supple nontender no JVD without thyromegaly Cardiac regular rate and rhythm any extra sounds or murmur heard Abdomen.  Soft nontender positive bowel sounds without rebound Respiratory effort normal no respiratory distress without wheeze Skin.  Intact Neurologic.  Alert Contact with examiner.  Oriented to person place and time.  Fair awareness of deficits.  5/5 strength in upper extremities right lower extremity 3/5 left lower extremity 5/5 sensation decreased to right leg.  He/She  has had improvement in activity tolerance, balance, postural control as well as ability to compensate for deficits. He/She has had improvement in functional use RUE/LUE  and RLE/LLE as well as improvement in awareness.  Ambulates 300 feet x 2 rolling walker supervision.  Sit to stand stand pivot transfer supervision.  Complete ADLs bed mobility modified independent.  Full family teaching completed plan discharged to home       Disposition: Discharged to home   Diet: Regular  Special Instructions: No driving smoking or alcohol  Medications at discharge 1.  Tylenol as needed 2.  Xanax 0.25 mg twice daily as needed 3.  Norvasc 10 mg p.o. daily 4.  Aspirin 81 mg p.o. daily 5.  Lipitor 80 mg p.o.  daily 6.  Plavix 75 mg daily x9 days and stop 7.  Enablex 15 mg p.o. daily 8.  Cymbalta 30 mg p.o. daily 9.  HCTZ 12.5 mg p.o. daily 10.  Lamictal 100 mg p.o. nightly 11.  Magnesium gluconate 250 mg p.o. nightly 12.  Lyrica 75 mg p.o. twice daily 13.  Inderal 10 mg p.o. twice daily 14.  Trazodone 50 mg p.o. nightly  30-35 minutes were spent completing discharge summary and discharge planning  Discharge Instructions     Ambulatory referral to Neurology   Complete by: As directed    An appointment is requested in approximately: 4 weeks ACA/MCA watershed infarct   Ambulatory referral to Physical Medicine Rehab   Complete by: As directed    Moderate complexity follow-up 1 to 2 weeks ACA/MCA watershed infarction        Follow-up Information     Kirsteins, Victorino Sparrow, MD Follow up.   Specialty: Physical Medicine and Rehabilitation Why: Office to call for appointment Contact information: [address] [place] [address] [place] [address] [phone]                 Signed: Charlton Amor 12/04/2021, 5:47 AM

## 2021-12-03 NOTE — Progress Notes (Signed)
Patient ID: Kristin Burgess, female   DOB: 01/11/1961, 60 y.o.   MRN: [id number] Team Conference Report to Patient/Family  Team Conference discussion was reviewed with the patient and caregiver, including goals, any changes in plan of care and target discharge date.  Patient and caregiver express understanding and are in agreement.  The patient has a target discharge date of 12/05/21.  Sw met with the patient, provided conference updates. Patient happy about upcoming d/c date. DME reviewed, patient would prefer a rollator vs a rolling walker, sw to discuss with therapy team. SW will provide patient with [facility] transportation information. No additional questions or concerns, sw will continue to follow up.  Dyanne Iha 12/03/2021, 1:44 PM

## 2021-12-03 NOTE — Progress Notes (Signed)
Occupational Therapy Session Note  Patient Details  Name: Kristin Burgess MRN: [id number] Date of Birth: [birth year]  Today's Date: 12/03/2021 OT Individual Time: 3825-0539 OT Individual Time Calculation (min): 69 min    Short Term Goals: Week 1:  OT Short Term Goal 1 (Week 1): Pt will don pants with max A and AE PRN. OT Short Term Goal 2 (Week 1): Pt will complete 1/3 steps of toileting with S. OT Short Term Goal 3 (Week 1): Pt will complete toilet transfer with min A + LRAD.  Skilled Therapeutic Interventions/Progress Updates:    Pt received standing at sink with rollator. Reports having completed ADL and made bed mod I. Educated on continuing to call for assist if up in moving, but is ok to get dressed sink with set-up A. No c/o pain and, agreeable to therapy. Session focus on dynamic standing balance, safety awareness, dual tasking, activity tolerance, IADL retraining in prep for improved ADL/IADL/func mobility performance + decreased caregiver burden.  Ambulated throughout session with close S and rollator, min cues for upright posture and to lighten stress on BUE. Occasional cues to lock/unlock brakes and to implement seated rest breaks as needed. No overt LOB.  Pt in need of new rollator for home use; per PT/SW, pt would have to pay copay for bariatric rollator. Issued hand-out of where to purchase on her own / medical discount supply companies in [place]. Pt unsure if she will be able to afford and is also interested in electric w/c to be able to shop at local stores. Will follow up with SW/PT on best options going forward.  Pt ambulated to and from hospital atrium. Required seated rest break due to fatigue prior to continuing. Able to locate 5 "shopping list" items in reasonable amount of time with only min Vcs to locate 2 specific items. Close S for balance throughout and appropriate use of rollator.  Pt left seated in recliner with chair pad alarm engaged, call bell in reach,  and all immediate needs met.    Therapy Documentation Precautions:  Precautions Precautions: Fall Precaution Comments: mild L hemi, chronic RLE pain Restrictions Weight Bearing Restrictions: No  Pain: no c/o   ADL: See Care Tool for more details. Therapy/Group: Individual Therapy  Volanda Napoleon MS, OTR/L  12/03/2021, 6:52 AM

## 2021-12-03 NOTE — Patient Care Conference (Signed)
Inpatient RehabilitationTeam Conference and Plan of Care Update Date: 12/03/2021   Time: 10:00 AM    Patient Name: Kristin Burgess      Medical Record Number: [id number]  Date of Birth: 12/06/1961 Sex: Female         Room/Bed: 4M06C/4M06C-01 Payor Info: Payor: MEDICARE / Plan: MEDICARE PART A AND B / Product Type: *No Product type* /    Admit Date/Time:  11/26/2021  2:19 PM  Primary Diagnosis:  Acute bilat watershed infarction [facility])  Hospital Problems: Principal Problem:   Acute bilat watershed infarction [facility])    Expected Discharge Date: Expected Discharge Date: 12/05/21  Team Members Present: Physician leading conference: Dr. Claudette Laws Social Worker Present: Lavera Guise, BSW Nurse Present: Chana Bode, RN PT Present: Grier Rocher, PT OT Present: Annye English, OT SLP Present: Eilene Ghazi, SLP PPS Coordinator present : Fae Pippin, SLP     Current Status/Progress Goal Weekly Team Focus  Bowel/Bladder   cont of bowel and bladder. last BM-12/13  remain cont  assess pain q shift and PRN   Swallow/Nutrition/ Hydration             ADL's   set-up A for UB/LB dressing, set-up UB sink side bathing, S LB bathing, grooming; CGA to min A ambulatory bathroom transfers with RW  upgraded to S toilet transfers, toileting, LB bathing, CGA tub/shower transfers with RW; mod I UB dressing/grooming, set-up LB dressing  LUE NMR, self-care/balance/transfer/IADL retraining, activity tolerance, pt / family education/ DME / AD education   Mobility   Improving balance and activity tolerance since eval, but continues to demo L sided weakness with intermittent L knee buckle. Bed mobility = supervision; transfers = supervision/ CGA; ambulation = CGA using RW for average of 200 ft prior to seeing breakdown in quality and increased fatigue  Bed mobility at mod I; Transfers at supervision/ CGA; Ambulation at supervision using LRAD; Stairs = at least 5 steps with MinA using 1 HR   Continued L NMR, standing balance training, increasing L side strength and coordination, increased safety awareness in transfers/ ambulation, family education   Communication             Safety/Cognition/ Behavioral Observations            Pain   denies pain  pain<3  assess pain q shift and PRN   Skin   skin intact  remain free from breakdown  assess skin q shift and PRN     Discharge Planning:  Patient discharging home with the caregiver/friend to assist. 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 BP stable and epigastric pain has decreased; using tylenol and lyrica for pain control.   Patient on target to meet rehab goals: yes, currently needs set up - Mod I for self care at the sink level. Supervision for toileting using a rollator, transfers and gait up to 150-200'. Needs min assist for stairs. Goals for discharge set for supervision overall.  *See Care Plan and progress notes for long and short-term goals.   Revisions to Treatment Plan:  N/A   Teaching Needs: Safety, transfers, step management, medication management, secondary risk and dietary modification recommendations.  Current Barriers to Discharge: Decreased caregiver support  Possible Resolutions to Barriers: Family education DME; rollator Railing repair; unstable railing on entry to home     Medical Summary Current Status: LLE strength imprving , BP controlled , no pain c/os  Barriers to Discharge: Weight  Barriers to Discharge Comments: safety with ambulation Possible Resolutions to Barriers/Weekly Focus: cont rehab, d/c planning for f/u   Continued Need for Acute Rehabilitation Level of Care: The patient requires daily medical management by a physician with specialized training in physical medicine and rehabilitation for the following reasons: Direction of a multidisciplinary physical  rehabilitation program to maximize functional independence : Yes Medical management of patient stability for increased activity during participation in an intensive rehabilitation regime.: Yes Analysis of laboratory values and/or radiology reports with any subsequent need for medication adjustment and/or medical intervention. : Yes   I attest that I was present, lead the team conference, and concur with the assessment and plan of the team.   Chana Bode B 12/03/2021, 1:45 PM

## 2021-12-03 NOTE — Progress Notes (Signed)
Physical Therapy Session Note  Patient Details  Name: Kristin Burgess MRN: [id number] Date of Birth: 23-Jan-1961  Today's Date: 12/03/2021 PT Individual Time: 1100-1150 1300-1350 PT Individual Time Calculation (min): 50 min and 50 min   Short Term Goals: Week 1:  PT Short Term Goal 1 (Week 1): Pt will perform bed mobiltiy with min assist consistnetly PT Short Term Goal 2 (Week 1): Pt will transfer to [facility] with min assist and LRAD PT Short Term Goal 3 (Week 1): Pt will ambulate 28f with  min assist and LRAD PT Short Term Goal 4 (Week 1): Pt will ascend 4 steps with BUE support and mod assist   Skilled Therapeutic Interventions/Progress Updates:    Session 1.  Gait with rollator in hall 2 x 251fto and from day room. Supervision assist with cues for improved step length/height on the RLE intermittently   Standing balance on airex pad while engeage in gross motor task of Wii bowling. Able to tolerate 9 frames in standing improved from 5 on previous attempt. Min assist for safety with intermittent 0-2 UE support on rollator with cues for ankle strategy to correct posterior LOB x 2   Patient returned to room and performed stand pivot to recliner with supervsion assist and rollator. Pt left sitting in recliner with call bell in reach and all needs met.     Session 2.  Pt received sitting in WC and agreeable to PT  Gait training with Rollator 2x [id number] 2 x [id number] for improved safety with proper AD parts management as well as increased step height on the RLE    PT instructed pt in TUG: 17 sec (average of 3 trials; >13.5 sec indicates increased fall risk) also performed without AD x 14 sec, but increased toe drag noted on the RLE.   Stair management training with R rail on ascent and L Rail on descent to simulate access to mothQuest Diagnosticsrformed 2 x 4 steps CGA- supervision assist with cues for improved stp to pattern to reduce fall risk and improve safety  Pt performed 5 time  sit<>stand (5xSTS): 13 sec (>15 sec indicates increased fall risk) performed with UE pushing from arm rest and then from thighs, no change in time   Nustep reciprocal movement training x 8 min with cues for full ROM and consistent steps per min throughout.   Patient returned to room and performed stand pivot to recliner with supervision assist and rollator for safety . Pt left sitting in recliner with call bell in reach and all needs met.         Therapy Documentation Precautions:  Precautions Precautions: Fall Precaution Comments: mild L hemi, chronic RLE pain Restrictions Weight Bearing Restrictions: No General: PT Amount of Missed Time (min): 10 Minutes PT Missed Treatment Reason: Patient fatigue  Pain: Pain Assessment Pain Scale: 0-10 Pain Score: 0-No pain M   Therapy/Group: Individual Therapy  AustLorie Phenix14/2022, 1:36 PM

## 2021-12-03 NOTE — Progress Notes (Signed)
Physical Therapy Discharge Summary  Patient Details  Name: Kristin Burgess MRN: [id number] Date of Birth: November 29, 1961  Today's Date: 12/05/2021    Patient has met 8 of 8 long term goals due to improved activity tolerance, improved balance, improved postural control, increased strength, increased range of motion, decreased pain, functional use of  right lower extremity, improved attention, and improved coordination.  Patient to discharge at an ambulatory level Supervision.   Patient's care partner unavailable to provide the necessary physical assistance at discharge.  Reasons goals not met: All PT goals met   Recommendation:  Patient will benefit from ongoing skilled PT services in outpatient setting to continue to advance safe functional mobility, address ongoing impairments in balance, coordination, endurance, safety , and minimize fall risk.  Equipment: ROllator   Reasons for discharge: treatment goals met and discharge from hospital  Patient/family agrees with progress made and goals achieved: Yes  PT Discharge Precautions/Restrictions Fall  Pain  denies Pain Interference Pain Interference Pain Effect on Sleep: 1. Rarely or not at all Pain Interference with Therapy Activities: 1. Rarely or not at all Pain Interference with Day-to-Day Activities: 1. Rarely or not at all Vision/Perception  Vision - Assessment Eye Alignment: Within Functional Limits Ocular Range of Motion: Within Functional Limits Perception Perception: Within Functional Limits Praxis Praxis: Intact   Sensation Sensation Light Touch: Appears Intact Hot/Cold: Appears Intact Proprioception: Appears Intact Stereognosis: Appears Intact Coordination Gross Motor Movements are Fluid and Coordinated: No Fine Motor Movements are Fluid and Coordinated: Yes Coordination and Movement Description: limited by chronic back pain, generalized deconditioning, much improved from eval 9 Hole Peg Test: R: 29 seconds, L: 37  seconds Motor  Motor Motor: Hemiplegia;Ataxia Motor - Discharge Observations: Slight R hemi LE>UE  Mobility Bed Mobility Bed Mobility: Rolling Right;Rolling Left;Supine to Sit;Sit to Supine Rolling Right: Independent Rolling Left: Independent Supine to Sit: Independent Sit to Supine: Independent Transfers Transfers: Sit to Stand;Stand to Sit;Stand Pivot Transfers Sit to Stand: Independent with assistive device Stand to Sit: Independent with assistive device Stand Pivot Transfers: Independent with assistive device Transfer (Assistive device): Rollator Locomotion  Gait Ambulation: Yes Gait Assistance: Independent with assistive device Assistive device: Rollator Gait Gait: Yes Gait Pattern: Impaired Gait Pattern: Ataxic;Antalgic;Wide base of support Stairs / Additional Locomotion Stairs: Yes Wheelchair Mobility Wheelchair Mobility: No  Trunk/Postural Assessment  Cervical Assessment Cervical Assessment: Within Functional Limits Thoracic Assessment Thoracic Assessment: Exceptions to [facility] Lumbar Assessment Lumbar Assessment: Exceptions to [facility] Postural Control Postural Control: Deficits on evaluation  Balance Balance Balance Assessed: Yes Standardized Balance Assessment Standardized Balance Assessment: Berg Balance Test Berg Balance Test Sit to Stand: Able to stand without using hands and stabilize independently Standing Unsupported: Able to stand safely 2 minutes Sitting with Back Unsupported but Feet Supported on Floor or Stool: Able to sit safely and securely 2 minutes Stand to Sit: Sits safely with minimal use of hands Transfers: Able to transfer safely, minor use of hands Standing Unsupported with Eyes Closed: Able to stand 10 seconds with supervision Standing Ubsupported with Feet Together: Able to place feet together independently and stand for 1 minute with supervision From Standing, Reach Forward with Outstretched Arm: Can reach forward >12 cm safely (5") From  Standing Position, Pick up Object from Floor: Able to pick up shoe, needs supervision From Standing Position, Turn to Look Behind Over each Shoulder: Looks behind from both sides and weight shifts well Turn 360 Degrees: Able to turn 360 degrees safely but slowly Standing Unsupported, Alternately Place Feet on  Step/Stool: Able to complete 4 steps without aid or supervision Standing Unsupported, One Foot in Front: Able to take small step independently and hold 30 seconds Standing on One Leg: Tries to lift leg/unable to hold 3 seconds but remains standing independently Total Score: 43 Static Sitting Balance Static Sitting - Balance Support: Feet supported Static Sitting - Level of Assistance: 7: Independent Dynamic Sitting Balance Dynamic Sitting - Balance Support: Feet supported Dynamic Sitting - Level of Assistance: 7: Independent Static Standing Balance Static Standing - Balance Support: Bilateral upper extremity supported;During functional activity Static Standing - Level of Assistance: 6: Modified independent (Device/Increase time) Dynamic Standing Balance Dynamic Standing - Balance Support: Bilateral upper extremity supported;During functional activity Dynamic Standing - Level of Assistance: 6: Modified independent (Device/Increase time) Extremity Assessment      RLE Assessment RLE Assessment: Exceptions to [facility] General Strength Comments: Functionally 4/5 LLE Assessment LLE Assessment: Within Functional Limits General Strength Comments: grossly 4+/5    Kristin Burgess 12/05/2021, 1:30 PM

## 2021-12-03 NOTE — Progress Notes (Signed)
Physical Therapy Session Note  Patient Details  Name: Kristin Burgess MRN: [id number] Date of Birth: 02-Nov-1961  Today's Date: 12/03/2021 PT Individual Time: 1450-1530 PT Individual Time Calculation (min): 40 min   Short Term Goals: Week 1:  PT Short Term Goal 1 (Week 1): Pt will perform bed mobiltiy with min assist consistnetly PT Short Term Goal 2 (Week 1): Pt will transfer to [facility] with min assist and LRAD PT Short Term Goal 3 (Week 1): Pt will ambulate 67ft with  min assist and LRAD PT Short Term Goal 4 (Week 1): Pt will ascend 4 steps with BUE support and mod assist  Skilled Therapeutic Interventions/Progress Updates:     Pt seated in recliner to start session. Agreeable to PT tx but reports fatigue from busy day of therapies. Pt standing impulsively from recliner and ambulating to toilet with supervision and no AD - continent of bladder. Provided her rollator and educated on importance of rollator use for fall prevention.  She ambulated ~220ft with supervision and rollator to day room rehab gym. LPN asking for standing weight scale and this was obtained with CGA for safety - 268.5lb - LPN notified via secure chat.   Worked on dynamic standing balance on blue air-ex foam while tasked for playing horseshoes - CGA for safety and x1 LOB posteriorly that required minA for correcting. Completed x2 games total.   Pt then instructed in . She was able to ambulate 763ft with her rollator on level ground without seated or standing rest break. Age norm for 65-69 y/o is 1,762ft (pt is 87).   Pt completed 5xSTS from arm chair without UE support - 14.1 seconds. Scores >15 seconds indicate increased falls risk.   Pt ambulated back to her room with supervision and RW, ~126ft. Completed session seated in recliner with chair alarm on and all needs within reach. We discussed various DC planning, home safety, fall prevention strategies during rest breaks during session.   Therapy  Documentation Precautions:  Precautions Precautions: Fall Precaution Comments: mild L hemi, chronic RLE pain Restrictions Weight Bearing Restrictions: No General:    Therapy/Group: Individual Therapy  Tresten Pantoja P Avigdor Dollar PT 12/03/2021, 7:38 AM

## 2021-12-04 MED ORDER — ALPRAZOLAM 0.25 MG PO TABS: 0.2500 mg | ORAL_TABLET | Freq: Two times a day (BID) | ORAL | 0 refills | Status: DC | PRN

## 2021-12-04 MED ORDER — CLOPIDOGREL BISULFATE 75 MG PO TABS: 75.0000 mg | ORAL_TABLET | Freq: Every day | ORAL | 0 refills | Status: AC

## 2021-12-04 MED ORDER — SOLIFENACIN SUCCINATE 10 MG PO TABS: 10.0000 mg | ORAL_TABLET | Freq: Every day | ORAL | 0 refills | Status: DC

## 2021-12-04 MED ORDER — TRAZODONE HCL 50 MG PO TABS: 50.0000 mg | ORAL_TABLET | Freq: Every day | ORAL | 0 refills | Status: DC

## 2021-12-04 MED ORDER — HYDROCHLOROTHIAZIDE 12.5 MG PO TABS: 12.5000 mg | ORAL_TABLET | Freq: Every day | ORAL | 0 refills | Status: AC

## 2021-12-04 MED ORDER — DULOXETINE HCL 30 MG PO CPEP: 30.0000 mg | ORAL_CAPSULE | Freq: Every day | ORAL | 3 refills | Status: AC

## 2021-12-04 MED ORDER — LAMOTRIGINE ER 100 MG PO TB24: 100.0000 mg | ORAL_TABLET | Freq: Every day | ORAL | 0 refills | Status: AC

## 2021-12-04 MED ORDER — ATORVASTATIN CALCIUM 80 MG PO TABS: 80.0000 mg | ORAL_TABLET | Freq: Every day | ORAL | 0 refills | Status: AC

## 2021-12-04 MED ORDER — SENNOSIDES-DOCUSATE SODIUM 8.6-50 MG PO TABS: 2.0000 | ORAL_TABLET | Freq: Two times a day (BID) | ORAL | Status: AC

## 2021-12-04 MED ORDER — PROPRANOLOL HCL 10 MG PO TABS: 10.0000 mg | ORAL_TABLET | Freq: Two times a day (BID) | ORAL | 0 refills | Status: AC

## 2021-12-04 MED ORDER — PREGABALIN 75 MG PO CAPS: 75.0000 mg | ORAL_CAPSULE | Freq: Two times a day (BID) | ORAL | 0 refills | Status: AC

## 2021-12-04 MED ORDER — AMLODIPINE BESYLATE 10 MG PO TABS: 10.0000 mg | ORAL_TABLET | Freq: Every day | ORAL | 0 refills | Status: AC

## 2021-12-04 MED ORDER — MAGNESIUM GLUCONATE 500 MG PO TABS: 250.0000 mg | ORAL_TABLET | Freq: Every day | ORAL | 0 refills | Status: AC

## 2021-12-04 NOTE — Progress Notes (Signed)
PROGRESS NOTE   Subjective/Complaints:  Pt has early ride tomorrow , would like to speak to psych prior to d/c but discussed pt may need to do this on an OP basis  ROS-  Pt denies SOB, abd pain, CP, N/V/C/D, and vision changes   Objective:   No results found. No results for input(s): WBC, HGB, HCT, PLT in the last 72 hours. Recent Labs    12/03/21 0526  CREATININE 0.86     Intake/Output Summary (Last 24 hours) at 12/04/2021 0749 Last data filed at 12/04/2021 0700 Gross per 24 hour  Intake 660 ml  Output --  Net 660 ml         Physical Exam: Vital Signs Blood pressure (!) 122/54, pulse (!) 56, temperature 98.3 F (36.8 C), temperature source Oral, resp. rate 18, height 5\' 5"  (1.651 m), weight 122.2 kg, SpO2 93 %.    General: No acute distress Mood and affect are appropriate Heart: Regular rate and rhythm no rubs murmurs or extra sounds Lungs: Clear to auscultation, breathing unlabored, no rales or wheezes Abdomen: Positive bowel sounds, soft nontender to palpation, nondistended Extremities: No clubbing, cyanosis, or edema Skin: No evidence of breakdown, no evidence of rash   Neurologic: Cranial nerves II through XII intact, motor strength is 5/5 in bilateral deltoid, bicep, tricep, grip, hip flexor, knee extensors, ankle dorsiflexor and plantar flexor Sensory exam normal sensation to light touch and proprioception in bilateral upper and lower extremities Cerebellar sithout dysmetria   Musculoskeletal: Full range of motion in all 4 extremities. No joint swelling   Assessment/Plan: 1. Functional deficits which require 3+ hours per day of interdisciplinary therapy in a comprehensive inpatient rehab setting. Physiatrist is providing close team supervision and 24 hour management of active medical problems listed below. Physiatrist and rehab team continue to assess barriers to discharge/monitor patient  progress toward functional and medical goals  Care Tool:  Bathing    Body parts bathed by patient: Right arm, Left arm, Chest, Abdomen, Front perineal area, Left upper leg, Right upper leg, Face   Body parts bathed by helper: Buttocks, Right lower leg, Left lower leg     Bathing assist Assist Level: Moderate Assistance - Patient 50 - 74%     Upper Body Dressing/Undressing Upper body dressing   What is the patient wearing?: Pull over shirt    Upper body assist Assist Level: Supervision/Verbal cueing    Lower Body Dressing/Undressing Lower body dressing      What is the patient wearing?: Underwear/pull up     Lower body assist Assist for lower body dressing: Total Assistance - Patient < 25%     Toileting Toileting    Toileting assist Assist for toileting: Moderate Assistance - Patient 50 - 74%     Transfers Chair/bed transfer  Transfers assist     Chair/bed transfer assist level: Supervision/Verbal cueing     Locomotion Ambulation   Ambulation assist      Assist level: Contact Guard/Touching assist Assistive device: Walker-rolling Max distance: 300 ft   Walk 10 feet activity   Assist     Assist level: Contact Guard/Touching assist Assistive device: Walker-rolling   Walk 50 feet activity  Assist Walk 50 feet with 2 turns activity did not occur: Safety/medical concerns  Assist level: Contact Guard/Touching assist Assistive device: Walker-rolling    Walk 150 feet activity   Assist Walk 150 feet activity did not occur: Safety/medical concerns  Assist level: Contact Guard/Touching assist Assistive device: Walker-rolling    Walk 10 feet on uneven surface  activity   Assist Walk 10 feet on uneven surfaces activity did not occur: Safety/medical concerns         Wheelchair     Assist Is the patient using a wheelchair?: Yes Type of Wheelchair: Manual    Wheelchair assist level: Minimal Assistance - Patient > 75% Max  wheelchair distance: 115ft    Wheelchair 50 feet with 2 turns activity    Assist        Assist Level: Minimal Assistance - Patient > 75%   Wheelchair 150 feet activity     Assist      Assist Level: Moderate Assistance - Patient 50 - 74%   Blood pressure (!) 122/54, pulse (!) 56, temperature 98.3 F (36.8 C), temperature source Oral, resp. rate 18, height 5\' 5"  (1.651 m), weight 122.2 kg, SpO2 93 %.  Medical Problem List and Plan: 1.  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  Impaired mobility, ambulating 20 feet: continue lovenox.             -antiplatelet therapy: Aspirin 81 mg daily and Plavix 75 mg day x3 weeks total then aspirin alone 3. Pain Management: Neurontin 300 mg daily  12/11- will change to Lyrica 75 mg BID since her Cr will tolerate; not allergic and Gabapentin not working for her.  4. Mood: Trazodone 50 mg nightly, Cymbalta 30 mg daily Xanax as needed             -antipsychotic agents: N/A 5. Neuropsych: This patient is capable of making decisions on her own behalf. 6. Skin/Wound Care: Routine skin checks 7. Fluids/Electrolytes/Nutrition: Routine in and outs with follow-up chemistries 8.  Hypertension.  HCTZ 12.5 mg daily, Norvasc 10 mg daily, Inderal 10 mg twice daily.  Monitor with increased mobility  12/11- BP controlled- con't regimen 9.  History of AVM with surgery 2015.  Follow-up outpatient 10.  Seizure disorder.  Continue Lamictal 100 mg nightly 11.  Hyperlipidemia.  Lipitor 12.  Tobacco abuse.  Provide counseling, we discussed that this is the most important factor in preventing recurrent stroke  13.  Obesity.  BMI 43.93.  Dietary  follow-up 14.Insomnia/constipation/hypertension/anxiety: start magnesium gluconate 250mg   HS  15.  Epigastric discomfort with swallowing start protonix and monitor , improving 16. Depressed  Neuropsych eval ordered  LOS: 8 days A FACE TO FACE EVALUATION WAS PERFORMED  2016 12/04/2021, 7:49 AM

## 2021-12-04 NOTE — Progress Notes (Signed)
Occupational Therapy Session Note  Patient Details  Name: Kristin Burgess MRN: [id number] Date of Birth: 10-22-1961  Today's Date: 12/04/2021 OT Individual Time: 3875-6433 OT Individual Time Calculation (min): 45 min    Short Term Goals: Week 1:  OT Short Term Goal 1 (Week 1): Pt will don pants with max A and AE PRN. OT Short Term Goal 2 (Week 1): Pt will complete 1/3 steps of toileting with S. OT Short Term Goal 3 (Week 1): Pt will complete toilet transfer with min A + LRAD.  Skilled Therapeutic Interventions/Progress Updates:    1:1 GRAD DAY. Pt was already at the sink finishign washing herself when arrived. Pt completed bathing and dressing sit to stand mod I. Pt was able to ambulate around the room without a device mod I with slow speed. Pt did report her left shoulder bothering her last night and this morning- applied heating pad on her shoulder and pt reported she had relief. Pt also made bed navigating around the bed. Pt reports having a high bed and requiring a step to get into bed. Pt cleaned up her room mod I. It was recommended to wear shoes in the room instead of slippers.   Pt made mod I in the room.   Therapy Documentation Precautions:  Precautions Precautions: Fall Precaution Comments: mild L hemi, chronic RLE pain Restrictions Weight Bearing Restrictions: No  Pain: Pain Assessment Pain Scale: 0-10 Pain Score: 2     Therapy/Group: Individual Therapy  Roney Mans [facility] 12/04/2021, 9:22 AM

## 2021-12-04 NOTE — Evaluation (Signed)
Recreational Therapy Assessment and Plan  Patient Details  Name: Kristin Burgess MRN: [id number] Date of Birth: [birth year] Today's Date: 12/04/2021  Rehab Potential:  Good ELOS:   d/c 12/16  Assessment  Hospital Problem: Principal Problem:   Acute bilat watershed infarction [facility])     Past Medical History:      Past Medical History:  Diagnosis Date   AVM (arteriovenous malformation) brain     Brain bleed ([place])     Depression     Hyperlipidemia     Hypertension      Past Surgical History:       Past Surgical History:  Procedure Laterality Date   BRAIN SURGERY   2015   TUBAL LIGATION          Assessment & Plan Clinical Impression: Patient is a 60 year old right-handed female with history of hypertension, tobacco use, AVM, with surgery 2015, seizure disorder maintained on Lamictal depression, morbid obesity BMI 39.93, .  Per chart review patient lives alone.  1 level home 5 steps to entry.  Modified independent for ambulation.  Family and friends assist with ADLs.  Presented to [facility] 11/22/2021 with progressive right leg weakness over an 8-day period as well as a recent fall when getting out of bed without loss of conscious.  She denied any lightheadedness or syncope.  Cranial CT scan showed subtle hypodensity of the deep white matter of the left parietal lobe consistent with infarction although age-indeterminate.  Unchanged nonacute encephalomalacia of the right parietal vertex.  CT angiogram head and neck no large vessel occlusion or significant stenosis.  No evidence of recurrent AVM.  MRI scattered small acute infarcts in the parasagittal left frontal parietal lobe much greater than right frontal lobe representing ACA/MCA watershed infarcts.  Patient did not receive tPA.  Echocardiogram with ejection fraction of 60 to 65% no wall motion abnormalities grade 1 diastolic dysfunction.  Admission chemistries unremarkable glucose 107, alcohol negative, hemoglobin A1c 5.9.  Neurology follow-up  currently maintained on aspirin 81 mg daily and Plavix 75 mg daily for CVA prophylaxis x3 weeks then aspirin alone.  Subcutaneous Lovenox for DVT prophylaxis.  Tolerating a regular consistency diet.  Therapy evaluations completed due to patient's right side weakness and decreased functional mobility was admitted for a comprehensive rehab program. Patient transferred to CIR on 11/26/2021.    Pt presents with decreased activity tolerance, decreased functional mobility, decreased balance decreased coordination Limiting pt's independence with leisure/community pursuits. Met with pt today to discuss leisure interests and activity analysis/modifications.  Pt states confidence in her mobility at Mod I level.  Pt excited about discharge but stated some stressors in discharging home with mother.  Pt able to verbalize strategies to reduce stress and participate in activities of choice once home with mother. Plan  No further TR  Recommendations for other services: None   Discharge Criteria: Patient will be discharged from TR if patient refuses treatment 3 consecutive times without medical reason.  If treatment goals not met, if there is a change in medical status, if patient makes no progress towards goals or if patient is discharged from hospital.  The above assessment, treatment plan, treatment alternatives and goals were discussed and mutually agreed upon: by patient  [place] 12/04/2021, 4:10 PM

## 2021-12-04 NOTE — Progress Notes (Signed)
Inpatient Rehabilitation Care Coordinator Discharge Note   Patient Details  Name: Shantele Reller MRN: [id number] Date of Birth: 04-25-1961   Discharge location: Home  Length of Stay: 9 days  Discharge activity level: sup/MOD I  Home/community participation: friends, neighbors and mother  Patient response OE:UMPNTI Literacy - How often do you need to have someone help you when you read instructions, pamphlets, or other written material from your doctor or pharmacy?: Never  Patient response RW:ERXVQM Isolation - How often do you feel lonely or isolated from those around you?: Never  Services provided included: SW, Pharmacy, TR, CM, RN, SLP, OT, RD, MD, PT  Financial Services:  Financial Services Utilized: Medicare    Choices offered to/list presented to: pt  Follow-up services arranged:  Outpatient    Outpatient Servicies: OP at [facility] [place]      Patient response to transportation need: Is the patient able to respond to transportation needs?: Yes In the past 12 months, has lack of transportation kept you from medical appointments or from getting medications?: No In the past 12 months, has lack of transportation kept you from meetings, work, or from getting things needed for daily living?: No    Comments (or additional information):  Patient/Family verbalized understanding of follow-up arrangements:  Yes  Individual responsible for coordination of the follow-up plan: self  Confirmed correct DME delivered: Andria Rhein 12/04/2021    Andria Rhein

## 2021-12-04 NOTE — Progress Notes (Signed)
Physical Therapy Session Note  Patient Details  Name: Kristin Burgess MRN: [id number] Date of Birth: 1961-11-15  Today's Date: 12/04/2021 PT Individual Time: 1000-1110 PT Individual Time Calculation (min): 70 min   Short Term Goals: Week 1:  PT Short Term Goal 1 (Week 1): Pt will perform bed mobiltiy with min assist consistnetly PT Short Term Goal 2 (Week 1): Pt will transfer to [facility] with min assist and LRAD PT Short Term Goal 3 (Week 1): Pt will ambulate 33ft with  min assist and LRAD PT Short Term Goal 4 (Week 1): Pt will ascend 4 steps with BUE support and mod assist  Skilled Therapeutic Interventions/Progress Updates:    Patient received sitting up in recliner, agreeable to PT. She reports 2/10 pain in L shoulder- heat pack in place. PT providing rest breaks, distractions and repositioning to assist with pain management. Patient ambulating to therapy gym with rollator and distant supervision. WBOS maintained. PT attempting to tighten brakes, however- unsuccessful. Vendor notified and stated that he will provide patient with new walker prior to dc. Patient completing Sharlene Motts balance scale scoring a 43/56 indicating increased risk for falling and benefit from use of AD. Discussed findings with patient and she verbalized understanding. Patient able to transfer in/out of car with supervision and verbal cues for safety. Patient ambulating over uneven ground with rollator and supervision. Stairs with light CGA x12 and B HR with verbal cues for which leg to ascend/descend with for safety. DME vendor providing patient with new rollator. Patient ambulating 323ft back to her room ModI. Frequent seated rest breaks throughout session due to fatigue. Patient provided with paper handouts on healthy diets per her request. Remaining up in recliner, needs within reach. Patient also provided with hot pack again for L shoulder pain.   Therapy Documentation Precautions:  Precautions Precautions: Fall Precaution  Comments: mild L hemi, chronic RLE pain Restrictions Weight Bearing Restrictions: No General:   Vital Signs:  Pain: Pain Assessment Pain Scale: 0-10 Pain Score: 2  Pain Type: Acute pain Pain Location: Shoulder Pain Orientation: Left Mobility: Bed Mobility Bed Mobility: Rolling Right;Rolling Left;Supine to Sit;Sit to Supine Rolling Right: Independent Rolling Left: Independent Supine to Sit: Independent Sit to Supine: Independent Transfers Transfers: Sit to Stand;Stand to Sit;Stand Pivot Transfers Sit to Stand: Independent with assistive device Stand to Sit: Independent with assistive device Stand Pivot Transfers: Independent with assistive device Transfer (Assistive device): Rollator Locomotion : Gait Ambulation: Yes Gait Assistance: Independent with assistive device Gait Distance (Feet): 250 Feet Assistive device: Rollator Gait Gait: Yes Gait Pattern: Impaired Gait Pattern: Ataxic;Antalgic;Wide base of support Stairs / Additional Locomotion Stairs: Yes Wheelchair Mobility Wheelchair Mobility: No  Trunk/Postural Assessment : Cervical Assessment Cervical Assessment: Within Functional Limits Thoracic Assessment Thoracic Assessment: Exceptions to [facility] Lumbar Assessment Lumbar Assessment: Exceptions to [facility] Postural Control Postural Control: Deficits on evaluation  Balance: Balance Balance Assessed: Yes Standardized Balance Assessment Standardized Balance Assessment: Berg Balance Test Berg Balance Test Sit to Stand: Able to stand without using hands and stabilize independently Standing Unsupported: Able to stand safely 2 minutes Sitting with Back Unsupported but Feet Supported on Floor or Stool: Able to sit safely and securely 2 minutes Stand to Sit: Sits safely with minimal use of hands Transfers: Able to transfer safely, minor use of hands Standing Unsupported with Eyes Closed: Able to stand 10 seconds with supervision Standing Ubsupported with Feet Together:  Able to place feet together independently and stand for 1 minute with supervision From Standing, Reach Forward with Outstretched  Arm: Can reach forward >12 cm safely (5") From Standing Position, Pick up Object from Floor: Able to pick up shoe, needs supervision From Standing Position, Turn to Look Behind Over each Shoulder: Looks behind from both sides and weight shifts well Turn 360 Degrees: Able to turn 360 degrees safely but slowly Standing Unsupported, Alternately Place Feet on Step/Stool: Able to complete 4 steps without aid or supervision Standing Unsupported, One Foot in Front: Able to take small step independently and hold 30 seconds Standing on One Leg: Tries to lift leg/unable to hold 3 seconds but remains standing independently Total Score: 43 Static Sitting Balance Static Sitting - Balance Support: Feet supported Static Sitting - Level of Assistance: 7: Independent Dynamic Sitting Balance Dynamic Sitting - Balance Support: Feet supported Dynamic Sitting - Level of Assistance: 7: Independent Static Standing Balance Static Standing - Balance Support: Bilateral upper extremity supported;During functional activity Static Standing - Level of Assistance: 6: Modified independent (Device/Increase time) Dynamic Standing Balance Dynamic Standing - Balance Support: Bilateral upper extremity supported;During functional activity Dynamic Standing - Level of Assistance: 6: Modified independent (Device/Increase time) Exercises:   Other Treatments:      Therapy/Group: Individual Therapy  Elizebeth Koller, PT, DPT, CBIS  12/04/2021, 10:38 AM

## 2021-12-04 NOTE — Progress Notes (Signed)
Inpatient Rehabilitation Discharge Medication Review by a Pharmacist  A complete drug regimen review was completed for this patient to identify any potential clinically significant medication issues.  High Risk Drug Classes Is patient taking? Indication by Medication  Antipsychotic No   Anticoagulant No   Antibiotic No   Opioid No   Antiplatelet Yes Plavix + ASA (through 12/24) then ASA alone s/p CVA  Hypoglycemics/insulin No   Vasoactive Medication Yes Norvasc, HCTZ, propranolol for HTN  Chemotherapy No   Other No      Type of Medication Issue Identified Description of Issue Recommendation(s)  Drug Interaction(s) (clinically significant)     Duplicate Therapy     Allergy     No Medication Administration End Date     Incorrect Dose     Additional Drug Therapy Needed     Significant med changes from prior encounter (inform family/care partners about these prior to discharge).    Other  DAPT through 12/24, then ASA alone Added stop date to Plavix on discharge meds    Clinically significant medication issues were identified that warrant physician communication and completion of prescribed/recommended actions by midnight of the next day:  Yes  Name of provider notified for urgent issues identified: Deatra Ina  Provider Method of Notification: Chat    Pharmacist comments: No f/u required  Time spent performing this drug regimen review (minutes):    Kristin Burgess, PharmD, BCPS Clinical Staff Pharmacist [email or web address]  Pasty Spillers 12/04/2021 12:27 PM

## 2021-12-05 NOTE — Progress Notes (Signed)
Patient provided with discharge instructions/medications reviewed by  Deatra Ina, PA. Staff assisted patient with personal belonging and provided transfer assistance into private car with no issues. Patient discharged.

## 2021-12-05 NOTE — Plan of Care (Signed)
Problem: RH Balance Goal: LTG Patient will maintain dynamic sitting balance (PT) Description: LTG:  Patient will maintain dynamic sitting balance with assistance during mobility activities (PT) Outcome: Completed/Met Goal: LTG Patient will maintain dynamic standing balance (PT) Description: LTG:  Patient will maintain dynamic standing balance with assistance during mobility activities (PT) Outcome: Completed/Met   Problem: RH Bed Mobility Goal: LTG Patient will perform bed mobility with assist (PT) Description: LTG: Patient will perform bed mobility with assistance, with/without cues (PT). Outcome: Completed/Met   Problem: RH Bed to Chair Transfers Goal: LTG Patient will perform bed/chair transfers w/assist (PT) Description: LTG: Patient will perform bed to chair transfers with assistance (PT). Outcome: Completed/Met   Problem: RH Car Transfers Goal: LTG Patient will perform car transfers with assist (PT) Description: LTG: Patient will perform car transfers with assistance (PT). Outcome: Completed/Met   Problem: RH Ambulation Goal: LTG Patient will ambulate in controlled environment (PT) Description: LTG: Patient will ambulate in a controlled environment, # of feet with assistance (PT). Outcome: Completed/Met Goal: LTG Patient will ambulate in home environment (PT) Description: LTG: Patient will ambulate in home environment, # of feet with assistance (PT). Outcome: Completed/Met   Problem: RH Stairs Goal: LTG Patient will ambulate up and down stairs w/assist (PT) Description: LTG: Patient will ambulate up and down # of stairs with assistance (PT) Outcome: Completed/Met

## 2021-12-05 NOTE — Progress Notes (Signed)
PROGRESS NOTE   Subjective/Complaints: No issues overnite , feels ready to go home Mod I in room , legs fatigue  ROS-  Pt denies SOB, abd pain, CP, N/V/C/D, and vision changes   Objective:   No results found. No results for input(s): WBC, HGB, HCT, PLT in the last 72 hours. Recent Labs    12/03/21 0526  CREATININE 0.86     Intake/Output Summary (Last 24 hours) at 12/05/2021 0803 Last data filed at 12/04/2021 1700 Gross per 24 hour  Intake 440 ml  Output --  Net 440 ml         Physical Exam: Vital Signs Blood pressure (!) 147/68, pulse 62, temperature 98.4 F (36.9 C), temperature source Oral, resp. rate 18, height 5\' 5"  (1.651 m), weight 122.2 kg, SpO2 93 %.  General: No acute distress Mood and affect are appropriate Heart: Regular rate and rhythm no rubs murmurs or extra sounds Lungs: Clear to auscultation, breathing unlabored, no rales or wheezes Abdomen: Positive bowel sounds, soft nontender to palpation, nondistended Extremities: No clubbing, cyanosis, or edema Skin: No evidence of breakdown, no evidence of rash  Musculoskeletal: Full range of motion in all 4 extremities. No joint swelling  Neurologic: Cranial nerves II through XII intact, motor strength is 5/5 in bilateral deltoid, bicep, tricep, grip, hip flexor, knee extensors, ankle dorsiflexor and plantar flexor Sensory exam normal sensation to light touch and proprioception in bilateral upper and lower extremities Cerebellar sithout dysmetria    Assessment/Plan: 1. 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  Stable for D/C today F/u PCP in 3-4 weeks F/u PM&R 2 weeks See D/C summary See D/C instructions  Care Tool:  Bathing    Body parts bathed by patient: Right arm, Left arm, Chest, Abdomen, Front perineal area, Left upper leg, Right upper leg, Face, Buttocks, Right lower leg, Left lower leg   Body parts bathed by helper:  Buttocks, Right lower leg, Left lower leg     Bathing assist Assist Level: Independent with assistive device     Upper Body Dressing/Undressing Upper body dressing   What is the patient wearing?: Pull over shirt, Bra    Upper body assist Assist Level: Independent    Lower Body Dressing/Undressing Lower body dressing      What is the patient wearing?: Underwear/pull up, Pants     Lower body assist Assist for lower body dressing: Independent with assitive device     Toileting Toileting    Toileting assist Assist for toileting: Independent with assistive device     Transfers Chair/bed transfer  Transfers assist     Chair/bed transfer assist level: Independent with assistive device     Locomotion Ambulation   Ambulation assist      Assist level: Contact Guard/Touching assist Assistive device: Walker-rolling Max distance: 300 ft   Walk 10 feet activity   Assist     Assist level: Contact Guard/Touching assist Assistive device: Walker-rolling   Walk 50 feet activity   Assist Walk 50 feet with 2 turns activity did not occur: Safety/medical concerns  Assist level: Contact Guard/Touching assist Assistive device: Walker-rolling    Walk 150 feet activity   Assist Walk 150  feet activity did not occur: Safety/medical concerns  Assist level: Contact Guard/Touching assist Assistive device: Walker-rolling    Walk 10 feet on uneven surface  activity   Assist Walk 10 feet on uneven surfaces activity did not occur: Safety/medical concerns         Wheelchair     Assist Is the patient using a wheelchair?: Yes Type of Wheelchair: Manual    Wheelchair assist level: Minimal Assistance - Patient > 75% Max wheelchair distance: 148ft    Wheelchair 50 feet with 2 turns activity    Assist        Assist Level: Minimal Assistance - Patient > 75%   Wheelchair 150 feet activity     Assist      Assist Level: Moderate Assistance -  Patient 50 - 74%   Blood pressure (!) 147/68, pulse 62, temperature 98.4 F (36.9 C), temperature source Oral, resp. rate 18, height 5\' 5"  (1.651 m), weight 122.2 kg, SpO2 93 %.  Medical Problem List and Plan: 1.  Right side weakness functional deficits secondary to scattered acute infarct parasagittal left frontal parietal lobe much greater then right frontal lobe representing ACA/MCA watershed infarcts on 11/22/21- mainly with ACA distribution deficits             d/c home today   2.  Impaired mobility, ambulating 20 feet: continue lovenox.             -antiplatelet therapy: Aspirin 81 mg daily and Plavix 75 mg day x3 weeks total then aspirin alone 3. Pain Management: Neurontin 300 mg daily  12/11- will change to Lyrica 75 mg BID since her Cr will tolerate; not allergic and Gabapentin not working for her.  4. Mood: Trazodone 50 mg nightly, Cymbalta 30 mg daily Xanax as needed             -antipsychotic agents: N/A 5. Neuropsych: This patient is capable of making decisions on her own behalf. 6. Skin/Wound Care: Routine skin checks 7. Fluids/Electrolytes/Nutrition: Routine in and outs with follow-up chemistries 8.  Hypertension.  HCTZ 12.5 mg daily, Norvasc 10 mg daily, Inderal 10 mg twice daily.  Monitor with increased mobility   Vitals:   12/04/21 1929 12/05/21 0637  BP: (!) 127/49 (!) 147/68  Pulse: 69 62  Resp: 18 18  Temp: 98.3 F (36.8 C) 98.4 F (36.9 C)  SpO2: 98% 93%  Good control   9.  History of AVM with surgery 2015.  Follow-up outpatient 10.  Seizure disorder.  Continue Lamictal 100 mg nightly 11.  Hyperlipidemia.  Lipitor 12.  Tobacco abuse.  Provide counseling, we discussed that this is the most important factor in preventing recurrent stroke  13.  Obesity.  BMI 43.93.  Dietary follow-up 14.Insomnia/constipation/hypertension/anxiety: start magnesium gluconate 250mg   HS  15.  Epigastric discomfort with swallowing start protonix and monitor , improving 16.  Depressed  Neuropsych eval ordered  LOS: 9 days A FACE TO FACE EVALUATION WAS PERFORMED  2016 12/05/2021, 8:03 AM

## 2021-12-08 ENCOUNTER — Encounter: Payer: Self-pay | Admitting: Registered Nurse

## 2021-12-09 ENCOUNTER — Telehealth: Payer: Self-pay | Admitting: *Deleted

## 2021-12-09 NOTE — Telephone Encounter (Signed)
Transition Care Management Unsuccessful Follow-up Telephone Call  Date of discharge and from where:  12/05/21 [facility] rehab  Attempts:  1st Attempt  Reason for unsuccessful TCM follow-up call:  Left voice message

## 2021-12-10 NOTE — Telephone Encounter (Signed)
Transitional Care call--Kristin Burgess and friend caregiver Kristin Burgess. Verbal permission given to speak with Kristin Burgess about appointments and medical needs. Ph number [phone]    Are you/is patient experiencing any problems since coming home? Are there any questions regarding any aspect of care? Cannot stay at home alone so is staying temporarily with her parents in [facility] address [address] [place] [address] Are there any questions regarding medications administration/dosing? Are meds being taken as prescribed? Patient should review meds with caller to confirm Confirmed she has all her medications Have there been any falls? Yes she has had 2 without injury.  Has Home Health been to the house and/or have they contacted you? If not, have you tried to contact them? Can we help you contact them? HH was not arranged, discharge note says outpt rehab at [facility] but patient is needing therapy at home. They have been trying to reach out to [facility] about this. Are bowels and bladder emptying properly? Are there any unexpected incontinence issues? If applicable, is patient following bowel/bladder programs? NO Problems Any fevers, problems with breathing, unexpected pain? NO Are there any skin problems or new areas of breakdown? NO Has the patient/family member arranged specialty MD follow up (ie cardiology/neurology/renal/surgical/etc)?  Can we help arrange? Neuro not arranged yet Does the patient need any other services or support that we can help arrange?they are wanting to speak with Trula Ore about in home therapy, I will send her an email and make her aware Are caregivers following through as expected in assisting the patient?YES Has the patient quit smoking, drinking alcohol, or using drugs as recommended? N/A  Appointment 12/16/21 @ 8:20 with Jacalyn Lefevre NP Address reviewed and notice to watch mail for packet from the office [address]

## 2021-12-16 ENCOUNTER — Encounter: Payer: Medicare Other | Attending: Registered Nurse | Admitting: Registered Nurse

## 2021-12-16 ENCOUNTER — Other Ambulatory Visit: Payer: Self-pay

## 2021-12-16 ENCOUNTER — Encounter: Payer: Self-pay | Admitting: Registered Nurse

## 2021-12-16 ENCOUNTER — Encounter: Payer: Medicare Other | Admitting: Registered Nurse

## 2021-12-16 VITALS — BP 135/81 | HR 60 | Temp 98.2°F | Ht 65.0 in | Wt 273.0 lb

## 2021-12-16 DIAGNOSIS — E7849 Other hyperlipidemia: Secondary | ICD-10-CM | POA: Diagnosis present

## 2021-12-16 DIAGNOSIS — I10 Essential (primary) hypertension: Secondary | ICD-10-CM | POA: Diagnosis present

## 2021-12-16 DIAGNOSIS — Z87898 Personal history of other specified conditions: Secondary | ICD-10-CM | POA: Insufficient documentation

## 2021-12-16 DIAGNOSIS — I6389 Other cerebral infarction: Secondary | ICD-10-CM | POA: Insufficient documentation

## 2021-12-16 DIAGNOSIS — Z8774 Personal history of (corrected) congenital malformations of heart and circulatory system: Secondary | ICD-10-CM | POA: Diagnosis present

## 2021-12-16 MED ORDER — ALPRAZOLAM 0.25 MG PO TABS: 0.2500 mg | ORAL_TABLET | Freq: Two times a day (BID) | ORAL | 0 refills | Status: AC | PRN

## 2021-12-16 NOTE — Progress Notes (Signed)
Subjective:     Patient ID: Kristin Burgess, female   DOB: November 27, 1961, 60 y.o.   MRN: [id number]  HPI: Kristin Burgess is a 60 y.o. female who is here for HFU appointment for follow up of her  Acute bilateral watershed infarction, History of AVM( Arteriovenous Malformation, History of Seizures and Hyperlipidemia. She presented to [facility] on 11/22/2021, for leg weakness. H&P on 11/22/2021 DR. Eckstat Chief Complaint: leg weakness   HPI: Kristin Burgess is a 60 y.o. female with history of hypertension, tobacco use, seizures, AVM, major depression, morbid obesity, who presents with leg weakness.   Patient reports that she has felt weakness and heaviness in her right leg since Thanksgiving day (8 days prior).  Then this morning she fell as she was getting out of bed which is what prompted her to present to the ED.  Prior to this she has had pain in bilateral legs and swelling but the weakness has been new.  She does not describe the sensation of her right leg as numbness or tingling, merely a heaviness like it is difficult to move.  Denies any other weakness, numbness, tingling anywhere else in her body.  Denies any chest pain, palpitations.  She does endorse smoking a pack of cigarettes about every 3 days.  Her father passed away from a stroke.   In the ED initial vital signs notable only for mild hypertension.  Lab work-up showed unremarkable CMP and BMP.  Unremarkable cholesterol studies.  EKG was sinus, no acute ischemic changes, overall appeared similar to prior.  Noncon CT head was obtained which showed a new hypodensity in the left parietal lobe, likely subacute to chronic in age.  She also underwent a plain film study of the lumbar spine and hips which were negative for any acute findings.  She also had a CTA head and neck which showed no large vessel occlusion or significant stenosis in the neck but there is new marked stenosis of the left A2 ACA.  She was then admitted for further management, concern for  stroke.  CT Head WO Contrast:  IMPRESSION: 1. There is new, subtle hypodensity of the deep white matter in the left parietal lobe, consistent with infarction although age indeterminate, most likely subacute to chronic. Consider MRI to more sensitively evaluate for acute diffusion restricting infarction if suspected. 2. Unchanged nonacute encephalomalacia of the right parietal vertex.  CT Angio:  IMPRESSION: No large vessel occlusion.  No significant stenosis in the neck.   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   No evidence of recurrent AVM.   Two small inferiorly directed outpouchings from the distal supraclinoid right ICA reflecting   MR Brain: IMPRESSION: Scattered small acute infarcts in the parasagittal left frontoparietal lobes much greater than right frontal lobe probably representing ACA and ACA/MCA watershed infarcts in the setting of azygous A2 ACA stenosis seen on CTA.   Chronic right parietal infarct. Chronic microvascular ischemic changes.  Neurology was consulted, she was maintained on aspirin and Plavix for 3 weeks and then aspirin alone.   [name] was admitted to inpatient Rehabilitation on 11/26/2021 and discharged home on 12/05/2021. [name] was scheduled to have outpatient Therapy at [facility], call was placed to [facility] outpatient therapy. She has a scheduled appointment with [facility] on 01/09/2022, she reports.  She denies any pain. She rated her pain 5 on The Health and History Form.  Also reports she has  a good appetite.     Pain Inventory Average Pain 6 Pain Right Now 5 My pain is intermittent and aching  LOCATION OF PAIN  Right Leg  BOWEL Number of stools per week: 5 Oral laxative use Yes  Type of laxative Senokot Enema or suppository use No  History of colostomy No  Incontinent No   BLADDER Normal  Mobility use a cane use a walker how many minutes can  you walk? 5 minutes ability to climb steps?  yes do you drive?  no Do you have any goals in this area?  yes  Function disabled: date disabled Do not remember I need assistance with the following:  dressing, meal prep, household duties, and shopping Do you have any goals in this area?  yes  Neuro/Psych weakness numbness tremor tingling trouble walking confusion depression anxiety  Prior Studies Any changes since last visit?  no  Physicians involved in your care Any changes since last visit?  no   Family History  Problem Relation Age of Onset   Lung cancer Maternal Grandmother    Colon cancer Maternal Grandfather    Kidney cancer Neg Hx    Bladder Cancer Neg Hx    Social History   Socioeconomic History   Marital status: Single    Spouse name: Not on file   Number of children: Not on file   Years of education: Not on file   Highest education level: Not on file  Occupational History   Not on file  Tobacco Use   Smoking status: Every Day    Packs/day: 0.25    Types: Cigarettes   Smokeless tobacco: Never  Vaping Use   Vaping Use: Never used  Substance and Sexual Activity   Alcohol use: No   Drug use: No   Sexual activity: Not Currently  Other Topics Concern   Not on file  Social History Narrative   Not on file   Social Determinants of Health   Financial Resource Strain: Not on file  Food Insecurity: Not on file  Transportation Needs: Not on file  Physical Activity: Not on file  Stress: Not on file  Social Connections: Not on file   Past Surgical History:  Procedure Laterality Date   BRAIN SURGERY  2015   TUBAL LIGATION     Past Medical History:  Diagnosis Date   AVM (arteriovenous malformation) brain    Brain bleed (HCC)    Depression    Hyperlipidemia    Hypertension    There were no vitals taken for this visit.  Opioid Risk Score:   Fall Risk Score:  `1  Depression screen PHQ 2/9  No flowsheet data found.  Review of Systems   Musculoskeletal:  Positive for gait problem.       Right leg pain Problem with balance  Neurological:  Positive for tremors, weakness and numbness.       Tingling  Psychiatric/Behavioral:  Positive for confusion.        Anxiety  All other systems reviewed and are negative.     Objective:   Physical Exam Vitals and nursing note reviewed.  Constitutional:      Appearance: Normal appearance.  Cardiovascular:     Rate and Rhythm: Normal rate and regular rhythm.     Pulses: Normal pulses.     Heart sounds: Normal heart sounds.  Pulmonary:     Effort: Pulmonary effort is normal.     Breath sounds: Normal breath sounds.  Musculoskeletal:  Cervical back: Normal range of motion and neck supple.     Comments: Normal Muscle Bulk and Muscle Testing Reveals:  Upper Extremities: Full ROM and Muscle Strength 4/5  Lower Extremities: Decreased ROM and Muscle Strength 5/5 Arises from Table Slowly using cane for support Narrow Based  Gait     Skin:    General: Skin is warm and dry.  Neurological:     Mental Status: She is alert and oriented to person, place, and time.  Psychiatric:        Mood and Affect: Mood normal.        Behavior: Behavior normal.      Assessment:Plan  Acute bilateral watershed infarction, History of AVM( Arteriovenous Malformation, History of Seizures: She has an appointment with [facility] on 01/09/2022.   Hyperlipidemia.: Continue current medication regimen: PCP Following. Continue to Monitor.   F/U with Dr Wynn Banker in 4- 6 weeks

## 2021-12-18 ENCOUNTER — Ambulatory Visit: Payer: Medicare Other | Attending: Physician Assistant

## 2021-12-18 ENCOUNTER — Ambulatory Visit: Payer: Medicare Other | Admitting: Speech Pathology

## 2021-12-23 ENCOUNTER — Ambulatory Visit: Payer: Medicare Other | Admitting: Speech Pathology

## 2021-12-23 ENCOUNTER — Ambulatory Visit: Payer: Medicare Other | Admitting: Occupational Therapy

## 2021-12-25 ENCOUNTER — Encounter: Payer: Medicare Other | Admitting: Registered Nurse

## 2021-12-25 ENCOUNTER — Ambulatory Visit: Payer: Medicare Other | Admitting: Occupational Therapy

## 2021-12-25 ENCOUNTER — Ambulatory Visit: Payer: Medicare Other | Admitting: Speech Pathology

## 2021-12-31 ENCOUNTER — Encounter: Payer: Medicare Other | Admitting: Occupational Therapy

## 2021-12-31 ENCOUNTER — Encounter: Payer: Medicare Other | Admitting: Speech Pathology

## 2021-12-31 ENCOUNTER — Ambulatory Visit: Payer: Medicare Other | Admitting: Physical Therapy

## 2022-01-05 ENCOUNTER — Encounter: Payer: Medicare Other | Admitting: Speech Pathology

## 2022-01-05 ENCOUNTER — Encounter: Payer: Medicare Other | Admitting: Occupational Therapy

## 2022-01-08 ENCOUNTER — Encounter: Payer: Medicare Other | Admitting: Occupational Therapy

## 2022-01-12 ENCOUNTER — Encounter: Payer: Medicare Other | Admitting: Occupational Therapy

## 2022-01-12 ENCOUNTER — Encounter: Payer: Medicare Other | Admitting: Speech Pathology

## 2022-01-14 ENCOUNTER — Encounter: Payer: Medicare Other | Admitting: Occupational Therapy

## 2022-01-19 ENCOUNTER — Other Ambulatory Visit: Payer: Self-pay

## 2022-01-19 ENCOUNTER — Encounter: Payer: Medicare Other | Admitting: Occupational Therapy

## 2022-01-19 ENCOUNTER — Encounter: Payer: Medicare Other | Admitting: Speech Pathology

## 2022-01-19 NOTE — Telephone Encounter (Signed)
Last appointment  TC 12/16/2021. Next visit 02/06/2022.   Rx request also sent to PCP Dr. Caroleen Hamman ph [phone], fax [phone].

## 2022-01-21 ENCOUNTER — Encounter: Payer: Medicare Other | Admitting: Occupational Therapy

## 2022-01-21 ENCOUNTER — Encounter: Payer: Medicare Other | Admitting: Speech Pathology

## 2022-01-26 ENCOUNTER — Ambulatory Visit: Payer: Medicare Other

## 2022-01-26 ENCOUNTER — Encounter: Payer: Medicare Other | Admitting: Speech Pathology

## 2022-01-27 ENCOUNTER — Ambulatory Visit: Payer: Medicare Other | Admitting: Physical Medicine & Rehabilitation

## 2022-01-28 ENCOUNTER — Encounter: Payer: Medicare Other | Admitting: Speech Pathology

## 2022-01-28 ENCOUNTER — Ambulatory Visit: Payer: Medicare Other

## 2022-01-28 ENCOUNTER — Encounter: Payer: Medicare Other | Admitting: Occupational Therapy

## 2022-02-02 ENCOUNTER — Encounter: Payer: Medicare Other | Admitting: Speech Pathology

## 2022-02-02 ENCOUNTER — Encounter: Payer: Medicare Other | Admitting: Occupational Therapy

## 2022-02-04 ENCOUNTER — Ambulatory Visit: Payer: Medicare Other

## 2022-02-04 ENCOUNTER — Encounter: Payer: Medicare Other | Admitting: Speech Pathology

## 2022-02-04 ENCOUNTER — Encounter: Payer: Medicare Other | Admitting: Occupational Therapy

## 2022-02-06 ENCOUNTER — Encounter: Payer: Medicare Other | Attending: Registered Nurse | Admitting: Physical Medicine & Rehabilitation

## 2022-02-06 DIAGNOSIS — I10 Essential (primary) hypertension: Secondary | ICD-10-CM | POA: Insufficient documentation

## 2022-02-06 DIAGNOSIS — I6389 Other cerebral infarction: Secondary | ICD-10-CM | POA: Insufficient documentation

## 2022-02-06 DIAGNOSIS — Z87898 Personal history of other specified conditions: Secondary | ICD-10-CM | POA: Insufficient documentation

## 2022-02-06 DIAGNOSIS — E7849 Other hyperlipidemia: Secondary | ICD-10-CM | POA: Insufficient documentation

## 2022-02-06 DIAGNOSIS — Z8774 Personal history of (corrected) congenital malformations of heart and circulatory system: Secondary | ICD-10-CM | POA: Insufficient documentation

## 2022-02-09 ENCOUNTER — Encounter: Payer: Medicare Other | Admitting: Speech Pathology

## 2022-02-09 ENCOUNTER — Encounter: Payer: Medicare Other | Admitting: Occupational Therapy

## 2022-02-11 ENCOUNTER — Encounter: Payer: Medicare Other | Admitting: Occupational Therapy

## 2022-02-11 ENCOUNTER — Ambulatory Visit: Payer: Self-pay | Admitting: Neurology

## 2022-02-11 ENCOUNTER — Encounter: Payer: Medicare Other | Admitting: Speech Pathology

## 2022-02-11 ENCOUNTER — Encounter: Payer: Self-pay | Admitting: Neurology

## 2022-02-16 ENCOUNTER — Ambulatory Visit: Payer: Medicare Other

## 2022-02-16 ENCOUNTER — Encounter: Payer: Medicare Other | Admitting: Occupational Therapy

## 2022-02-18 ENCOUNTER — Encounter: Payer: Medicare Other | Admitting: Occupational Therapy

## 2022-02-18 ENCOUNTER — Encounter: Payer: Medicare Other | Admitting: Speech Pathology

## 2022-02-18 ENCOUNTER — Ambulatory Visit: Payer: Medicare Other

## 2022-02-25 ENCOUNTER — Encounter: Payer: Medicare Other | Admitting: Speech Pathology

## 2022-02-25 ENCOUNTER — Ambulatory Visit: Payer: Medicare Other | Admitting: Physical Therapy

## 2022-02-25 ENCOUNTER — Encounter: Payer: Medicare Other | Admitting: Occupational Therapy

## 2022-03-02 ENCOUNTER — Ambulatory Visit: Payer: Medicare Other | Admitting: Physical Therapy

## 2022-03-02 ENCOUNTER — Encounter: Payer: Medicare Other | Admitting: Speech Pathology

## 2022-03-02 ENCOUNTER — Encounter: Payer: Medicare Other | Admitting: Occupational Therapy

## 2022-03-04 ENCOUNTER — Encounter: Payer: Medicare Other | Admitting: Occupational Therapy

## 2022-03-04 ENCOUNTER — Ambulatory Visit: Payer: Medicare Other

## 2022-03-04 ENCOUNTER — Encounter: Payer: Medicare Other | Admitting: Speech Pathology

## 2022-03-09 ENCOUNTER — Ambulatory Visit: Payer: Medicare Other | Admitting: Physical Therapy

## 2022-03-09 ENCOUNTER — Encounter: Payer: Medicare Other | Admitting: Speech Pathology

## 2022-03-09 ENCOUNTER — Encounter: Payer: Medicare Other | Admitting: Occupational Therapy

## 2022-03-11 ENCOUNTER — Encounter: Payer: Medicare Other | Admitting: Occupational Therapy

## 2022-03-11 ENCOUNTER — Ambulatory Visit: Payer: Medicare Other

## 2022-03-11 ENCOUNTER — Encounter: Payer: Medicare Other | Admitting: Speech Pathology

## 2022-03-16 ENCOUNTER — Encounter: Payer: Medicare Other | Admitting: Occupational Therapy

## 2022-03-16 ENCOUNTER — Ambulatory Visit: Payer: Medicare Other | Admitting: Physical Therapy

## 2022-03-16 ENCOUNTER — Encounter: Payer: Medicare Other | Admitting: Speech Pathology

## 2022-03-18 ENCOUNTER — Encounter: Payer: Medicare Other | Admitting: Occupational Therapy

## 2022-03-18 ENCOUNTER — Ambulatory Visit: Payer: Medicare Other

## 2022-03-18 ENCOUNTER — Encounter: Payer: Medicare Other | Admitting: Speech Pathology

## 2022-03-23 ENCOUNTER — Ambulatory Visit: Payer: Medicare Other | Admitting: Physical Therapy

## 2022-03-23 ENCOUNTER — Encounter: Payer: Medicare Other | Admitting: Occupational Therapy

## 2022-03-23 ENCOUNTER — Encounter: Payer: Medicare Other | Admitting: Speech Pathology

## 2022-03-25 ENCOUNTER — Ambulatory Visit: Payer: Medicare Other | Admitting: Physical Therapy

## 2022-03-25 ENCOUNTER — Encounter: Payer: Medicare Other | Admitting: Occupational Therapy

## 2022-03-25 ENCOUNTER — Encounter: Payer: Medicare Other | Admitting: Speech Pathology

## 2022-03-30 ENCOUNTER — Ambulatory Visit: Payer: Medicare Other | Admitting: Physical Therapy

## 2022-03-30 ENCOUNTER — Encounter: Payer: Medicare Other | Admitting: Occupational Therapy

## 2022-03-30 ENCOUNTER — Encounter: Payer: Medicare Other | Admitting: Speech Pathology

## 2022-04-01 ENCOUNTER — Ambulatory Visit: Payer: Medicare Other | Admitting: Physical Therapy

## 2022-04-01 ENCOUNTER — Encounter: Payer: Medicare Other | Admitting: Occupational Therapy

## 2022-04-01 ENCOUNTER — Encounter: Payer: Medicare Other | Admitting: Speech Pathology

## 2022-04-06 ENCOUNTER — Encounter: Payer: Medicare Other | Admitting: Occupational Therapy

## 2022-04-06 ENCOUNTER — Ambulatory Visit: Payer: Medicare Other | Admitting: Physical Therapy

## 2022-04-06 ENCOUNTER — Encounter: Payer: Medicare Other | Admitting: Speech Pathology

## 2022-04-08 ENCOUNTER — Ambulatory Visit: Payer: Medicare Other | Admitting: Physical Therapy

## 2022-04-08 ENCOUNTER — Encounter: Payer: Medicare Other | Admitting: Occupational Therapy

## 2022-04-08 ENCOUNTER — Encounter: Payer: Medicare Other | Admitting: Speech Pathology

## 2022-04-11 ENCOUNTER — Other Ambulatory Visit: Payer: Self-pay

## 2022-04-11 ENCOUNTER — Emergency Department
Admission: EM | Admit: 2022-04-11 | Discharge: 2022-04-11 | Disposition: A | Discharge status: Home / Self Care | Payer: Medicare Other | Attending: Emergency Medicine | Admitting: Emergency Medicine

## 2022-04-11 ENCOUNTER — Emergency Department: Payer: Medicare Other

## 2022-04-11 DIAGNOSIS — F419 Anxiety disorder, unspecified: Secondary | ICD-10-CM | POA: Diagnosis not present

## 2022-04-11 DIAGNOSIS — R42 Dizziness and giddiness: Secondary | ICD-10-CM | POA: Diagnosis present

## 2022-04-11 DIAGNOSIS — I10 Essential (primary) hypertension: Secondary | ICD-10-CM | POA: Insufficient documentation

## 2022-04-11 HISTORY — DX: Cerebral infarction, unspecified: I63.9

## 2022-04-11 LAB — BASIC METABOLIC PANEL
Anion gap: 10 (ref 5–15)
BUN: 12 mg/dL (ref 6–20)
CO2: 18 mmol/L — ABNORMAL LOW (ref 22–32)
Calcium: 9.1 mg/dL (ref 8.9–10.3)
Chloride: 113 mmol/L — ABNORMAL HIGH (ref 98–111)
Creatinine, Ser: 0.81 mg/dL (ref 0.44–1.00)
GFR, Estimated: >60 mL/min (ref >60)
Glucose, Bld: 113 mg/dL — ABNORMAL HIGH (ref 70–99)
Potassium: 4.5 mmol/L (ref 3.5–5.1)
Sodium: 141 mmol/L (ref 135–145)

## 2022-04-11 LAB — CBC
HCT: 44.4 % (ref 36.0–46.0)
Hemoglobin: 13.2 g/dL (ref 12.0–15.0)
MCH: 20.7 pg — ABNORMAL LOW (ref 26.0–34.0)
MCHC: 29.7 g/dL — ABNORMAL LOW (ref 30.0–36.0)
MCV: 69.7 fL — ABNORMAL LOW (ref 80.0–100.0)
Platelets: 256 10*3/uL (ref 150–400)
RBC: 6.37 MIL/uL — ABNORMAL HIGH (ref 3.87–5.11)
RDW: 18 % — ABNORMAL HIGH (ref 11.5–15.5)
WBC: 4.3 10*3/uL (ref 4.0–10.5)
nRBC: 0 % (ref 0.0–0.2)

## 2022-04-11 NOTE — ED Triage Notes (Signed)
Pt arrives via EMS from home for [facility] and hypertension- pt has a hx of hypertension and has taken her meds today- pt is also having dizziness- pt states she is inconsistent with taking her bp medication ?

## 2022-04-11 NOTE — ED Provider Notes (Signed)
? ?[facility] ?Provider Note ? ? ? Event Date/Time  ? First MD Initiated Contact with Patient 04/11/22 1246   ?  (approximate) ? ? ?History  ? ?Shortness of Breath ? ? ?HPI ? ?Kristin Burgess is a 61 y.o. female with history of CVA, high blood pressure who presents with complaints of dizziness.  Patient reports this morning she felt lightheaded.  She reports this made her quite anxious because she has had a history of a CVA in the past however her symptoms today were not similar to her stroke symptoms before.  She denies chest pain or palpitations.  No shortness of breath no fevers or chills.  Currently she feels quite well and has no complaints ?  ? ? ?Physical Exam  ? ?Triage Vital Signs: ?ED Triage Vitals  ?Enc Vitals Group  ?   BP 04/11/22 1250 (!) 119/100  ?   Pulse Rate 04/11/22 1245 77  ?   Resp 04/11/22 1245 (!) 28  ?   Temp 04/11/22 1246 98 ?F (36.7 ?C)  ?   Temp Source 04/11/22 1246 Oral  ?   SpO2 04/11/22 1245 97 %  ?   Weight 04/11/22 1242 127 kg (280 lb)  ?   Height 04/11/22 1242 1.651 m (5\' 5" )  ?   Head Circumference --   ?   Peak Flow --   ?   Pain Score 04/11/22 1242 0  ?   Pain Loc --   ?   Pain Edu? --   ?   Excl. in [place]? --   ? ? ?Most recent vital signs: ?Vitals:  ? 04/11/22 1315 04/11/22 1330  ?BP:  (!) 167/98  ?Pulse: 71 74  ?Resp: (!) 21 20  ?Temp:    ?SpO2: 95% 95%  ? ? ? ?General: Awake, no distress.  ?CV:  Good peripheral perfusion.  Regular rate and rhythm ?Resp:  Normal effort.  Clear to auscultation bilaterally ?Abd:  No distention.  ?Other:   ? ? ?ED Results / Procedures / Treatments  ? ?Labs ?(all labs ordered are listed, but only abnormal results are displayed) ?Labs Reviewed  ?BASIC METABOLIC PANEL - Abnormal; Notable for the following components:  ?    Result Value  ? Chloride 113 (*)   ? CO2 18 (*)   ? Glucose, Bld 113 (*)   ? All other components within normal limits  ?CBC - Abnormal; Notable for the following components:  ? RBC 6.37 (*)   ? MCV 69.7 (*)   ?  MCH 20.7 (*)   ? MCHC 29.7 (*)   ? RDW 18.0 (*)   ? All other components within normal limits  ? ? ? ?EKG ? ? ? ? ?RADIOLOGY ? ?Chest x-ray viewed interpret by me, no acute abnormality ? ? ?PROCEDURES: ? ?Critical Care performed:  ? ?Procedures ? ? ?MEDICATIONS ORDERED IN ED: ?Medications - No data to display ? ? ?IMPRESSION / MDM / ASSESSMENT AND PLAN / ED COURSE  ?I reviewed the triage vital signs and the nursing notes. ? ? ?Patient well-appearing and in no acute distress, asymptomatic at this time.  She had felt lightheaded earlier but now feels at baseline.  She thinks this was related to anxiety.  No chest pain shortness of breath fevers chills.  No calf pain or swelling.  No pleurisy.  No nausea vomiting or diaphoresis. ? ?Lab work today is reassuring, she does not feel further work-up is indicated as she thinks she became  anxious.  Discussed with patient return precautions, appropriate for discharge at this time. ? ? ? ? ?  ? ? ?FINAL CLINICAL IMPRESSION(S) / ED DIAGNOSES  ? ?Final diagnoses:  ?Dizziness  ? ? ? ?Rx / DC Orders  ? ?ED Discharge Orders   ? ? None  ? ?  ? ? ? ?Note:  This document was prepared using Dragon voice recognition software and may include unintentional dictation errors. ?  ?Lavonia Drafts, MD ?04/11/22 1504 ? ?

## 2022-04-13 ENCOUNTER — Ambulatory Visit: Payer: Medicare Other | Admitting: Physical Therapy

## 2022-04-13 ENCOUNTER — Encounter: Payer: Medicare Other | Admitting: Speech Pathology

## 2022-04-13 ENCOUNTER — Encounter: Payer: Medicare Other | Admitting: Occupational Therapy

## 2022-04-15 ENCOUNTER — Encounter: Payer: Medicare Other | Admitting: Occupational Therapy

## 2022-04-15 ENCOUNTER — Ambulatory Visit: Payer: Medicare Other | Admitting: Physical Therapy

## 2022-04-15 ENCOUNTER — Encounter: Payer: Medicare Other | Admitting: Speech Pathology

## 2023-08-03 IMAGING — DX DG CHEST 1V PORT
1 series · 1 of 1 positions shown · non-contrast
Comparison: 06/29/2020

CLINICAL DATA: Shortness of breath.  Dizziness.  Hypertension.

EXAM:
PORTABLE CHEST 1 VIEW

[chest ap]
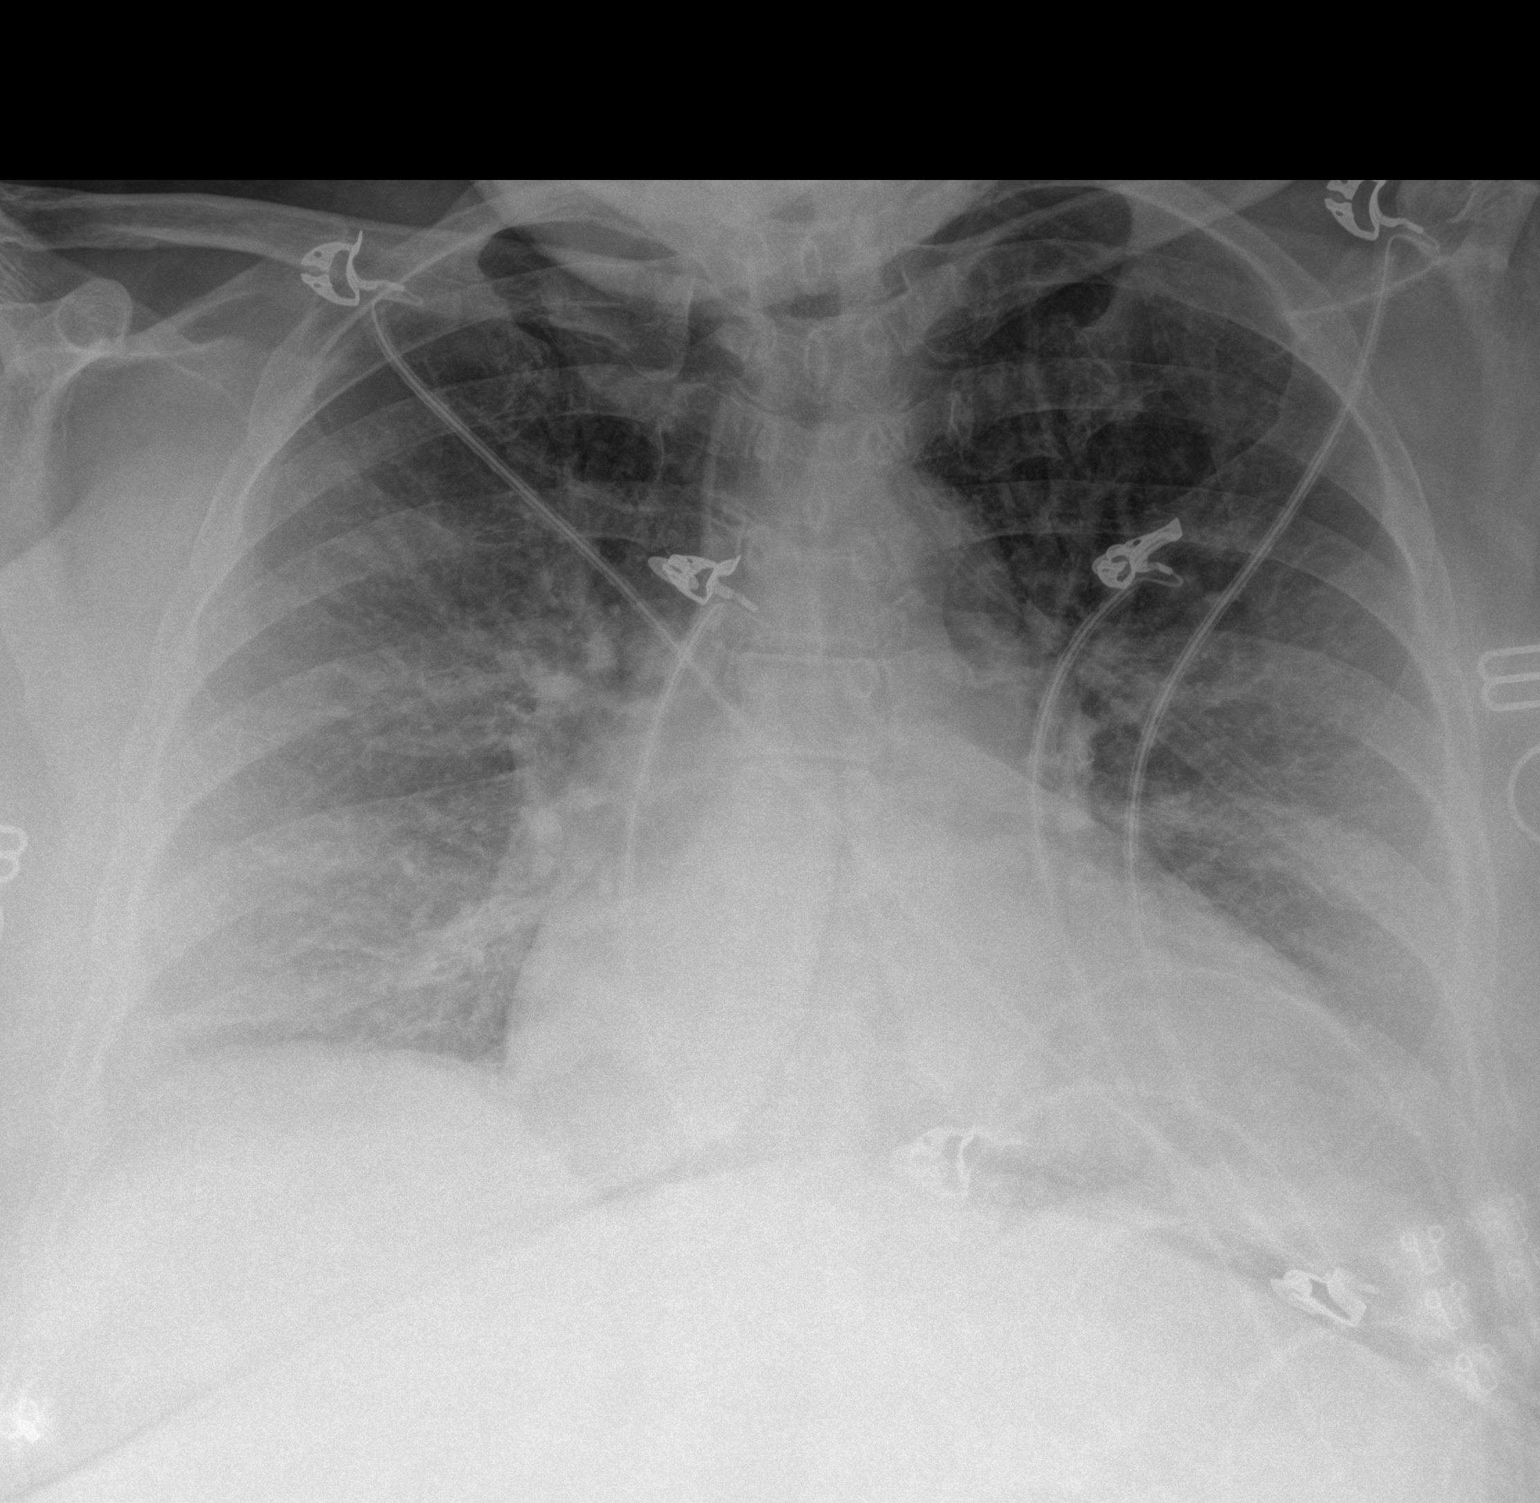

[1 of 1 positions shown; findings below may reference images not displayed]

FINDINGS: The heart size and mediastinal contours are within normal limits
allowing for low lung volumes. Both lungs are clear. The visualized
skeletal structures are unremarkable.
IMPRESSION: No active disease.

## 2023-10-08 ENCOUNTER — Emergency Department: Payer: 59

## 2023-10-08 ENCOUNTER — Other Ambulatory Visit: Payer: Self-pay

## 2023-10-08 DIAGNOSIS — M546 Pain in thoracic spine: Secondary | ICD-10-CM | POA: Diagnosis not present

## 2023-10-08 DIAGNOSIS — Y92410 Unspecified street and highway as the place of occurrence of the external cause: Secondary | ICD-10-CM | POA: Diagnosis not present

## 2023-10-08 DIAGNOSIS — R519 Headache, unspecified: Secondary | ICD-10-CM | POA: Insufficient documentation

## 2023-10-08 DIAGNOSIS — S1093XA Contusion of unspecified part of neck, initial encounter: Secondary | ICD-10-CM | POA: Insufficient documentation

## 2023-10-08 DIAGNOSIS — R0600 Dyspnea, unspecified: Secondary | ICD-10-CM | POA: Insufficient documentation

## 2023-10-08 DIAGNOSIS — S62511A Displaced fracture of proximal phalanx of right thumb, initial encounter for closed fracture: Secondary | ICD-10-CM | POA: Insufficient documentation

## 2023-10-08 DIAGNOSIS — M25562 Pain in left knee: Secondary | ICD-10-CM | POA: Diagnosis not present

## 2023-10-08 DIAGNOSIS — S6991XA Unspecified injury of right wrist, hand and finger(s), initial encounter: Secondary | ICD-10-CM | POA: Diagnosis present

## 2023-10-08 DIAGNOSIS — R0789 Other chest pain: Secondary | ICD-10-CM | POA: Insufficient documentation

## 2023-10-08 DIAGNOSIS — M25571 Pain in right ankle and joints of right foot: Secondary | ICD-10-CM | POA: Diagnosis not present

## 2023-10-08 DIAGNOSIS — I10 Essential (primary) hypertension: Secondary | ICD-10-CM | POA: Insufficient documentation

## 2023-10-08 LAB — BASIC METABOLIC PANEL
Anion gap: 6 (ref 5–15)
BUN: 17 mg/dL (ref 8–23)
CO2: 24 mmol/L (ref 22–32)
Calcium: 9 mg/dL (ref 8.9–10.3)
Chloride: 109 mmol/L (ref 98–111)
Creatinine, Ser: 0.87 mg/dL (ref 0.44–1.00)
GFR, Estimated: >60 mL/min (ref >60)
Glucose, Bld: 105 mg/dL — ABNORMAL HIGH (ref 70–99)
Potassium: 3.7 mmol/L (ref 3.5–5.1)
Sodium: 139 mmol/L (ref 135–145)

## 2023-10-08 LAB — CBC
HCT: 41.8 % (ref 36.0–46.0)
Hemoglobin: 13 g/dL (ref 12.0–15.0)
MCH: 21.2 pg — ABNORMAL LOW (ref 26.0–34.0)
MCHC: 31.1 g/dL (ref 30.0–36.0)
MCV: 68.1 fL — ABNORMAL LOW (ref 80.0–100.0)
Platelets: 219 10*3/uL (ref 150–400)
RBC: 6.14 MIL/uL — ABNORMAL HIGH (ref 3.87–5.11)
RDW: 17.4 % — ABNORMAL HIGH (ref 11.5–15.5)
WBC: 4.7 10*3/uL (ref 4.0–10.5)
nRBC: 0 % (ref 0.0–0.2)

## 2023-10-08 LAB — TROPONIN I (HIGH SENSITIVITY): Troponin I (High Sensitivity): 13 ng/L (ref <18)
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
## 2023-10-08 NOTE — ED Notes (Signed)
C collar placed in triage

## 2023-10-09 ENCOUNTER — Emergency Department: Payer: 59

## 2023-10-09 ENCOUNTER — Emergency Department
Admission: EM | Admit: 2023-10-09 | Discharge: 2023-10-09 | Disposition: A | Discharge status: Home / Self Care | Payer: 59 | Attending: Emergency Medicine | Admitting: Emergency Medicine

## 2023-10-09 DIAGNOSIS — S62511A Displaced fracture of proximal phalanx of right thumb, initial encounter for closed fracture: Secondary | ICD-10-CM

## 2023-10-09 DIAGNOSIS — R0789 Other chest pain: Secondary | ICD-10-CM

## 2023-10-09 DIAGNOSIS — S1093XA Contusion of unspecified part of neck, initial encounter: Secondary | ICD-10-CM

## 2023-10-09 LAB — TROPONIN I (HIGH SENSITIVITY): Troponin I (High Sensitivity): 20 ng/L — ABNORMAL HIGH (ref <18)

## 2023-10-09 MED ORDER — ONDANSETRON HCL 4 MG/2ML IJ SOLN
4.0000 mg | INTRAMUSCULAR | Status: AC
  Administered 2023-10-09: 4 mg via INTRAVENOUS
  Filled 2023-10-09: qty 2

## 2023-10-09 MED ORDER — IOHEXOL 350 MG/ML SOLN
75.0000 mL | Freq: Once | INTRAVENOUS | Status: AC | PRN
  Administered 2023-10-09: 75 mL via INTRAVENOUS

## 2023-10-09 MED ORDER — MORPHINE SULFATE (PF) 4 MG/ML IV SOLN
4.0000 mg | Freq: Once | INTRAVENOUS | Status: AC
  Administered 2023-10-09: 4 mg via INTRAVENOUS
  Filled 2023-10-09: qty 1

## 2023-10-09 MED ORDER — DOCUSATE SODIUM 100 MG PO CAPS: ORAL_CAPSULE | ORAL | 0 refills | Status: AC

## 2023-10-09 MED ORDER — OXYCODONE-ACETAMINOPHEN 5-325 MG PO TABS
1.0000 | ORAL_TABLET | ORAL | Status: DC | PRN
  Administered 2023-10-09: 1 via ORAL
  Filled 2023-10-09: qty 1

## 2023-10-09 MED ORDER — OXYCODONE-ACETAMINOPHEN 5-325 MG PO TABS
2.0000 | ORAL_TABLET | Freq: Once | ORAL | Status: AC
  Administered 2023-10-09: 2 via ORAL
  Filled 2023-10-09: qty 2

## 2023-10-09 MED ORDER — OXYCODONE-ACETAMINOPHEN 5-325 MG PO TABS: 2.0000 | ORAL_TABLET | Freq: Three times a day (TID) | ORAL | 0 refills | Status: DC | PRN

## 2023-10-09 NOTE — ED Notes (Signed)
Patient transported to CT 
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
## 2023-10-26 ENCOUNTER — Institutional Professional Consult (permissible substitution): Payer: 59 | Admitting: Pulmonary Disease

## 2023-11-02 ENCOUNTER — Encounter: Payer: Self-pay | Admitting: Pulmonary Disease
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
## 2024-11-07 NOTE — Telephone Encounter (Signed)
 LVM for patient to return call to schedule an OV for refill requests that were requested on her behalf by [facility]

## 2024-11-08 ENCOUNTER — Emergency Department

## 2024-11-08 ENCOUNTER — Emergency Department
Admission: EM | Admit: 2024-11-08 | Discharge: 2024-11-08 | Disposition: A | Discharge status: Home / Self Care | Attending: Emergency Medicine | Admitting: Emergency Medicine

## 2024-11-08 ENCOUNTER — Other Ambulatory Visit: Payer: Self-pay

## 2024-11-08 DIAGNOSIS — M5412 Radiculopathy, cervical region: Secondary | ICD-10-CM | POA: Diagnosis not present

## 2024-11-08 DIAGNOSIS — I10 Essential (primary) hypertension: Secondary | ICD-10-CM | POA: Diagnosis not present

## 2024-11-08 DIAGNOSIS — Z8673 Personal history of transient ischemic attack (TIA), and cerebral infarction without residual deficits: Secondary | ICD-10-CM | POA: Diagnosis not present

## 2024-11-08 DIAGNOSIS — M79601 Pain in right arm: Secondary | ICD-10-CM | POA: Diagnosis present

## 2024-11-08 MED ORDER — METHOCARBAMOL 500 MG PO TABS: 500.0000 mg | ORAL_TABLET | Freq: Three times a day (TID) | ORAL | 0 refills | Status: AC | PRN

## 2024-11-08 MED ORDER — METHOCARBAMOL 500 MG PO TABS
750.0000 mg | ORAL_TABLET | Freq: Once | ORAL | Status: AC
  Administered 2024-11-08: 750 mg via ORAL
  Filled 2024-11-08: qty 2

## 2024-11-08 MED ORDER — ONDANSETRON 4 MG PO TBDP
4.0000 mg | ORAL_TABLET | Freq: Once | ORAL | Status: AC
  Administered 2024-11-08: 4 mg via ORAL
  Filled 2024-11-08: qty 1

## 2024-11-08 MED ORDER — OXYCODONE HCL 5 MG PO TABS
5.0000 mg | ORAL_TABLET | Freq: Once | ORAL | Status: AC
  Administered 2024-11-08: 5 mg via ORAL
  Filled 2024-11-08: qty 1

## 2024-11-08 MED ORDER — LIDOCAINE 5 % EX PTCH
1.0000 | MEDICATED_PATCH | CUTANEOUS | 0 refills | Status: AC
Start: 2024-11-08 — End: 2024-11-18

## 2024-11-08 MED ORDER — ONDANSETRON 4 MG PO TBDP: 4.0000 mg | ORAL_TABLET | Freq: Three times a day (TID) | ORAL | 0 refills | Status: AC | PRN

## 2024-11-08 MED ORDER — LIDOCAINE 5 % EX PTCH
1.0000 | MEDICATED_PATCH | CUTANEOUS | Status: DC
  Administered 2024-11-08: 1 via TRANSDERMAL
  Filled 2024-11-08: qty 1

## 2024-11-08 MED ORDER — HYDROCODONE-ACETAMINOPHEN 5-325 MG PO TABS
1.0000 | ORAL_TABLET | Freq: Four times a day (QID) | ORAL | 0 refills | Status: AC | PRN
Start: 2024-11-08 — End: 2024-11-11

## 2024-11-08 MED ORDER — ACETAMINOPHEN 325 MG PO TABS
650.0000 mg | ORAL_TABLET | Freq: Once | ORAL | Status: AC
  Administered 2024-11-08: 650 mg via ORAL
  Filled 2024-11-08: qty 2

## 2024-11-08 NOTE — Discharge Instructions (Addendum)
 The x-ray of your shoulder did not show any abnormalities.  The CT scan of your cervical spine showed some narrowing of the openings in which the nerves of the spine traveled through.  I believe this is the cause of your pain.  This is best treated with multimodal pain management.  I have sent some lidocaine patches for you to use.  You can wear them for 12 hours and then take it off for 12 hours.   You can take 650 mg of Tylenol  and 600 mg of ibuprofen every 6 hours as needed for pain. You can use ice, heat, muscle creams and other topical pain relievers as well.  I have sent a strong pain medication called Norco to the pharmacy.  This medication can be taken every 4-6 hours as needed for severe or breakthrough pain.  This medication can cause dependency so only take if you are unable to control your pain with other medications.  It will make you sleepy so do not drive or operate heavy machinery after taking it.  Do not drink alcohol while taking this medication.  This medication can also cause constipation so please take an over-the-counter stool softener like MiraLAX or Colace while taking it.  This medication contains both hydrocodone  and acetaminophen .  Do not take Tylenol  at the same time.  You can take the Norco or Tylenol  but not both.  I have sent a muscle relaxer to your pharmacy. This can be taken every 8 hours as needed for muscle spasms. This medication is sedating, so do not drive for 8 hours after taking it.   Please schedule a follow-up appointment with orthopedics whose information is attached and return to the emergency department with any worsening symptoms.

## 2024-11-08 NOTE — ED Notes (Signed)
 Pt discharged to home, instructions, medications and pain management reviewed.  Pt verbalized understanding, voices no questions at this time.

## 2024-11-08 NOTE — ED Provider Notes (Signed)
 [facility] Provider Note    Event Date/Time   First MD Initiated Contact with Patient 11/08/24 1137     (approximate)   History   Arm Injury   HPI  Kristin Burgess is a 63 y.o. female with PMH of HTN, AVM, stroke, presents for evaluation of right arm pain. Pain began a few days ago. She denies injury, fall or trauma. Reports that is it swollen and she has not be able to use it due to the pain. Denies numbness and tingling.       Physical Exam   Triage Vital Signs: ED Triage Vitals  Encounter Vitals Group     BP 11/08/24 1115 (!) 152/105     Girls Systolic BP Percentile --      Girls Diastolic BP Percentile --      Boys Systolic BP Percentile --      Boys Diastolic BP Percentile --      Pulse Rate 11/08/24 1115 87     Resp 11/08/24 1115 18     Temp 11/08/24 1115 98.4 F (36.9 C)     Temp Source 11/08/24 1115 Oral     SpO2 11/08/24 1115 98 %     Weight 11/08/24 1116 250 lb (113.4 kg)     Height 11/08/24 1116 5' 5 (1.651 m)     Head Circumference --      Peak Flow --      Pain Score 11/08/24 1116 10     Pain Loc --      Pain Education --      Exclude from Growth Chart --     Most recent vital signs: Vitals:   11/08/24 1115  BP: (!) 152/105  Pulse: 87  Resp: 18  Temp: 98.4 F (36.9 C)  SpO2: 98%   General: Awake, no distress.  CV:  Good peripheral perfusion.  Resp:  Normal effort.  Abd:  No distention.  Right arm: Hand is very mildly swollen when compared to the left side, no overlying skin changes, exquisitely tender to palpation from the right side of the neck, through the right shoulder about halfway down the humerus, finger range of motion and intrinsic hand movements maintained, wrist range of motion maintained, unable to flex or extend the elbow, unable to perform any range of motion of the shoulder, grip strength is equal bilaterally, patient has weakness with elbow flexion and elbow extension as well as internal and external  rotation of the shoulder     ED Results / Procedures / Treatments   Labs (all labs ordered are listed, but only abnormal results are displayed) Labs Reviewed - No data to display   RADIOLOGY  Right shoulder and CT cervical spine obtained, interpreted the images as well as reviewed the radiologist report.  See ED course for interpretation. PROCEDURES:  Critical Care performed: No  Procedures   MEDICATIONS ORDERED IN ED: Medications  lidocaine (LIDODERM) 5 % 1 patch (1 patch Transdermal Patch Applied 11/08/24 1324)  ondansetron  (ZOFRAN -ODT) disintegrating tablet 4 mg (has no administration in time range)  acetaminophen  (TYLENOL ) tablet 650 mg (650 mg Oral Given 11/08/24 1317)  oxyCODONE  (Oxy IR/ROXICODONE ) immediate release tablet 5 mg (5 mg Oral Given 11/08/24 1324)  methocarbamol (ROBAXIN) tablet 750 mg (750 mg Oral Given 11/08/24 1324)     IMPRESSION / MDM / ASSESSMENT AND PLAN / ED COURSE  I reviewed the triage vital signs and the nursing notes.  63 year old female presents for evaluation of a right arm injury.  Blood pressure is elevated otherwise vital signs are stable.  Patient very uncomfortable on exam.  Differential diagnosis includes, but is not limited to, upper extremity DVT, adhesive capsulitis, arthritis, muscle strain, cervical radiculopathy  Patient's presentation is most consistent with acute complicated illness / injury requiring diagnostic workup.  Patient is quite tender on exam and is unable to move the arm much.  Will start with shoulder x-ray and will also get CT cervical spine to evaluate for cervical radiculopathy.  Given the level of her pain feel this is most consistent with cervical radiculopathy.  Will give patient pain medication as well as muscle relaxer and reassess for improvement.  Patient's CT cervical spine is consistent with cervical radiculopathy.  Since she is taking Plavix  will avoid NSAIDs for pain  management.  Recommended Tylenol , lidocaine  patches, muscle relaxer.  Will also send some pain medication with some Zofran .  Discussed following up with orthopedics.  Reviewed return precautions.  She voiced understanding, all questions were answered she was stable at discharge.  Clinical Course as of 11/08/24 1613  Wed Nov 08, 2024  1341 DG Shoulder Right Negative. [LD]  1521 Patient reports some improvement in her pain but feeling a little nauseous so will get her some zofran . [LD]  1522 CT Cervical Spine Wo Contrast IMPRESSION: 1. Slight asymmetric prominence and hyperemia of the right parotid gland, suggestive of parotitis. Recommend correlation with focal tenderness. 2. Air-fluid level in the right sphenoid sinus, concerning for acute sinusitis. 3. Cervical spondylosis with disc space narrowing, facet arthrosis, and uncovertebral hypertrophy at C4-5, with at least mild bilateral foraminal stenosis. 4. No high-grade osseous spinal canal stenosis. [LD]  1612 CT Cervical Spine Wo Contrast Edited results*  IMPRESSION: 1. No acute abnormality of the cervical spine. 2. Degenerative changes as above. Disc space narrowing at C4-5 is slightly increased. 3. Moderate foraminal stenosis at C4-5 and C5-6, similar to prior.  [LD]    Clinical Course User Index [LD] Cleaster Tinnie LABOR, PA-C     FINAL CLINICAL IMPRESSION(S) / ED DIAGNOSES   Final diagnoses:  Cervical radiculopathy     Rx / DC Orders   ED Discharge Orders          Ordered    lidocaine  (LIDODERM ) 5 %  Every 24 hours        11/08/24 1610    methocarbamol  (ROBAXIN ) 500 MG tablet  Every 8 hours PRN        11/08/24 1610    ondansetron  (ZOFRAN -ODT) 4 MG disintegrating tablet  Every 8 hours PRN        11/08/24 1610    HYDROcodone -acetaminophen  (NORCO/VICODIN) 5-325 MG tablet  Every 6 hours PRN        11/08/24 1610             Note:  This document was prepared using Dragon voice recognition software and may  include unintentional dictation errors.   Cleaster Tinnie LABOR, PA-C 11/08/24 1612    Suzanne Kirsch, MD 11/08/24 1814

## 2024-11-08 NOTE — ED Triage Notes (Signed)
 Pt sts that she has been having right arm pain and swelling. Pt is freely moving her right arm and no swelling is noted at this time.

## 2024-11-08 NOTE — ED Provider Notes (Signed)
 Shared visit   Presents to the emergency department with right shoulder pain and neck pain.  No significant weakness or dizziness.  No falls or trauma.  Significant pain with movement and palpation to the right trapezius and right shoulder.  +2 radial pulses that are equal and symmetric.  No obvious no of upper extremity edema.  Lidoderm  patch and pain medication  CT scan with cervical stenosis.  No other fracture or bony abnormality.  X-ray of the right shoulder was negative for acute findings.  On reevaluation improvement of pain.  Concern for cervical radiculopathy.  No weakness, do not feel that she needs an emergent MRI at this time.  Discussed symptomatic treatment and close follow-up as an outpatient.  Discussed return precautions for any ongoing worsening pain.  Low suspicion for dissection, ACS.   Suzanne Kirsch, MD 11/08/24 [phone]
